# Patient Record
Sex: Male | Born: 1955 | Race: White | Hispanic: No | Marital: Married | Smoking: Former smoker
Health system: Southern US, Community
[De-identification: ages and names within clinical notes are randomized; demographics above are authoritative.]

## PROBLEM LIST (undated history)

## (undated) DIAGNOSIS — I10 Essential (primary) hypertension: Secondary | ICD-10-CM

## (undated) DIAGNOSIS — N400 Enlarged prostate without lower urinary tract symptoms: Secondary | ICD-10-CM

## (undated) DIAGNOSIS — C61 Malignant neoplasm of prostate: Secondary | ICD-10-CM

## (undated) DIAGNOSIS — K219 Gastro-esophageal reflux disease without esophagitis: Secondary | ICD-10-CM

## (undated) DIAGNOSIS — C801 Malignant (primary) neoplasm, unspecified: Secondary | ICD-10-CM

## (undated) HISTORY — DX: Essential (primary) hypertension: I10

## (undated) HISTORY — DX: Benign prostatic hyperplasia without lower urinary tract symptoms: N40.0

## (undated) HISTORY — PX: HERNIA REPAIR: SHX51

## (undated) HISTORY — DX: Gastro-esophageal reflux disease without esophagitis: K21.9

## (undated) HISTORY — DX: Malignant (primary) neoplasm, unspecified: C80.1

## (undated) HISTORY — PX: OTHER SURGICAL HISTORY: SHX169

## (undated) HISTORY — PX: TONSILLECTOMY: SUR1361

## (undated) HISTORY — PX: PROSTATE BIOPSY: SHX241

---

## 2007-06-09 ENCOUNTER — Ambulatory Visit: Payer: Self-pay | Admitting: Family Medicine

## 2007-06-09 DIAGNOSIS — I10 Essential (primary) hypertension: Secondary | ICD-10-CM | POA: Insufficient documentation

## 2007-10-25 ENCOUNTER — Ambulatory Visit: Payer: Self-pay | Admitting: Family Medicine

## 2007-10-25 ENCOUNTER — Encounter: Admission: RE | Admit: 2007-10-25 | Discharge: 2007-10-25 | Payer: Self-pay | Admitting: Family Medicine

## 2007-10-25 DIAGNOSIS — M545 Low back pain, unspecified: Secondary | ICD-10-CM | POA: Insufficient documentation

## 2007-10-25 DIAGNOSIS — K219 Gastro-esophageal reflux disease without esophagitis: Secondary | ICD-10-CM | POA: Insufficient documentation

## 2007-10-26 ENCOUNTER — Encounter: Payer: Self-pay | Admitting: Family Medicine

## 2007-10-27 ENCOUNTER — Telehealth: Payer: Self-pay | Admitting: Family Medicine

## 2007-10-27 DIAGNOSIS — C61 Malignant neoplasm of prostate: Secondary | ICD-10-CM | POA: Insufficient documentation

## 2007-10-27 LAB — CONVERTED CEMR LAB
ALT: 30 units/L (ref 0–53)
AST: 15 units/L (ref 0–37)
Albumin: 4.7 g/dL (ref 3.5–5.2)
Alkaline Phosphatase: 62 units/L (ref 39–117)
BUN: 14 mg/dL (ref 6–23)
CO2: 23 meq/L (ref 19–32)
Calcium: 9.7 mg/dL (ref 8.4–10.5)
Chloride: 108 meq/L (ref 96–112)
Cholesterol: 168 mg/dL (ref 0–200)
Creatinine, Ser: 0.93 mg/dL (ref 0.40–1.50)
Glucose, Bld: 98 mg/dL (ref 70–99)
HDL: 37 mg/dL — ABNORMAL LOW (ref >39)
LDL Cholesterol: 113 mg/dL — ABNORMAL HIGH (ref 0–99)
PSA: 1.94 ng/mL (ref 0.10–4.00)
Potassium: 4.6 meq/L (ref 3.5–5.3)
Sodium: 142 meq/L (ref 135–145)
Total Bilirubin: 0.9 mg/dL (ref 0.3–1.2)
Total CHOL/HDL Ratio: 4.5
Total Protein: 7.2 g/dL (ref 6.0–8.3)
Triglycerides: 90 mg/dL (ref <150)
VLDL: 18 mg/dL (ref 0–40)

## 2007-10-28 ENCOUNTER — Encounter: Admission: RE | Admit: 2007-10-28 | Discharge: 2007-10-28 | Payer: Self-pay | Admitting: Family Medicine

## 2007-10-31 ENCOUNTER — Encounter: Payer: Self-pay | Admitting: Family Medicine

## 2007-11-02 ENCOUNTER — Encounter: Payer: Self-pay | Admitting: Family Medicine

## 2007-11-14 ENCOUNTER — Encounter: Payer: Self-pay | Admitting: Family Medicine

## 2007-11-14 DIAGNOSIS — Z87898 Personal history of other specified conditions: Secondary | ICD-10-CM | POA: Insufficient documentation

## 2007-11-23 ENCOUNTER — Ambulatory Visit: Payer: Self-pay | Admitting: Oncology

## 2007-11-23 ENCOUNTER — Encounter: Payer: Self-pay | Admitting: Family Medicine

## 2007-12-01 ENCOUNTER — Encounter: Payer: Self-pay | Admitting: Family Medicine

## 2007-12-02 ENCOUNTER — Encounter: Payer: Self-pay | Admitting: Family Medicine

## 2007-12-02 LAB — CBC WITH DIFFERENTIAL/PLATELET
Basophils Absolute: 0 10*3/uL (ref 0.0–0.1)
EOS%: 3.1 % (ref 0.0–7.0)
HGB: 15.9 g/dL (ref 13.0–17.1)
MCH: 31.8 pg (ref 28.0–33.4)
NEUT#: 3.5 10*3/uL (ref 1.5–6.5)
RBC: 5.01 10*6/uL (ref 4.20–5.71)
RDW: 13 % (ref 11.2–14.6)
lymph#: 1.9 10*3/uL (ref 0.9–3.3)

## 2007-12-02 LAB — COMPREHENSIVE METABOLIC PANEL
ALT: 22 U/L (ref 0–53)
AST: 12 U/L (ref 0–37)
Albumin: 4.6 g/dL (ref 3.5–5.2)
BUN: 12 mg/dL (ref 6–23)
Calcium: 9.4 mg/dL (ref 8.4–10.5)
Chloride: 108 mEq/L (ref 96–112)
Potassium: 4.3 mEq/L (ref 3.5–5.3)
Sodium: 139 mEq/L (ref 135–145)
Total Protein: 6.9 g/dL (ref 6.0–8.3)

## 2007-12-06 ENCOUNTER — Ambulatory Visit (HOSPITAL_COMMUNITY): Admission: RE | Admit: 2007-12-06 | Discharge: 2007-12-06 | Payer: Self-pay | Admitting: Oncology

## 2007-12-12 ENCOUNTER — Encounter: Payer: Self-pay | Admitting: Family Medicine

## 2008-02-28 ENCOUNTER — Ambulatory Visit: Payer: Self-pay | Admitting: Oncology

## 2008-03-01 ENCOUNTER — Encounter: Payer: Self-pay | Admitting: Family Medicine

## 2008-03-01 LAB — COMPREHENSIVE METABOLIC PANEL
Albumin: 4.2 g/dL (ref 3.5–5.2)
BUN: 16 mg/dL (ref 6–23)
Calcium: 9.3 mg/dL (ref 8.4–10.5)
Chloride: 107 mEq/L (ref 96–112)
Glucose, Bld: 146 mg/dL — ABNORMAL HIGH (ref 70–99)
Potassium: 4.1 mEq/L (ref 3.5–5.3)

## 2008-03-01 LAB — CBC WITH DIFFERENTIAL/PLATELET
Basophils Absolute: 0 10*3/uL (ref 0.0–0.1)
Eosinophils Absolute: 0.3 10*3/uL (ref 0.0–0.5)
HGB: 15.5 g/dL (ref 13.0–17.1)
NEUT#: 3.8 10*3/uL (ref 1.5–6.5)
RDW: 13 % (ref 11.2–14.6)
lymph#: 1.3 10*3/uL (ref 0.9–3.3)

## 2008-03-19 ENCOUNTER — Encounter: Payer: Self-pay | Admitting: Family Medicine

## 2008-06-27 ENCOUNTER — Telehealth: Payer: Self-pay | Admitting: Family Medicine

## 2008-08-31 ENCOUNTER — Ambulatory Visit: Payer: Self-pay | Admitting: Oncology

## 2008-09-21 ENCOUNTER — Encounter: Payer: Self-pay | Admitting: Family Medicine

## 2008-09-21 LAB — COMPREHENSIVE METABOLIC PANEL
ALT: 30 U/L (ref 0–53)
CO2: 23 mEq/L (ref 19–32)
Creatinine, Ser: 0.89 mg/dL (ref 0.40–1.50)
Glucose, Bld: 101 mg/dL — ABNORMAL HIGH (ref 70–99)
Total Bilirubin: 0.9 mg/dL (ref 0.3–1.2)

## 2008-09-21 LAB — CBC WITH DIFFERENTIAL/PLATELET
Basophils Absolute: 0 10*3/uL (ref 0.0–0.1)
Eosinophils Absolute: 0.2 10*3/uL (ref 0.0–0.5)
HGB: 16.3 g/dL (ref 13.0–17.1)
MCV: 89.4 fL (ref 81.6–98.0)
MONO%: 6.9 % (ref 0.0–13.0)
NEUT#: 5.1 10*3/uL (ref 1.5–6.5)
RDW: 12.9 % (ref 11.2–14.6)

## 2008-09-21 LAB — PSA: PSA: 1.32 ng/mL (ref 0.10–4.00)

## 2008-09-21 LAB — LACTATE DEHYDROGENASE: LDH: 121 U/L (ref 94–250)

## 2008-12-06 ENCOUNTER — Telehealth (INDEPENDENT_AMBULATORY_CARE_PROVIDER_SITE_OTHER): Payer: Self-pay | Admitting: *Deleted

## 2009-01-05 IMAGING — PT NM PET TUM IMG SKULL BASE T - THIGH
6 series · 25 of 25 positions shown · non-contrast
Comparison: None.

CLINICAL DATA: 51-year-old with prostate cancer.
 FDG PET-CT TUMOR IMAGING (SKULL BASE TO THIGHS):

 Fasting Blood Glucose: 133
TECHNIQUE: 18.2 mCi F-18 FDG were administered via the right forearm.  Full ring PET imaging was performed from the skull base through the mid-thighs 62 minutes after injection.  CT data was obtained and used for attenuation correction and anatomic localization only.  (This was not acquired as a diagnostic CT examination.)

[Series 1: pet ac · axial · 3.3mm · 4.69mm/px · z∈[-870,+0]mm · 5 of 267 slices shown]
[im 1/267]
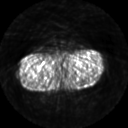
[im 67/267]
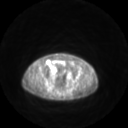
[im 134/267]
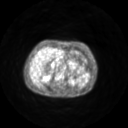
[im 200/267]
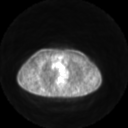
[im 267/267]
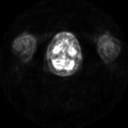

[Series 2: pet nac · axial · 3.3mm · 4.69mm/px · z∈[-870,+0]mm · 6 of 267 slices shown]
[im 1/267]
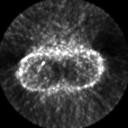
[im 54/267]
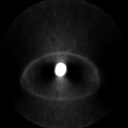
[im 107/267]
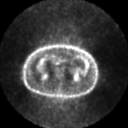
[im 160/267]
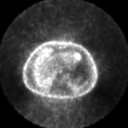
[im 213/267]
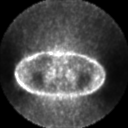
[im 267/267]
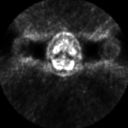

[Series 2: ct images · axial · 3.8mm · 0.98mm/px · z∈[-870,+0]mm · 5 of 267 slices shown]
[im 1/267]
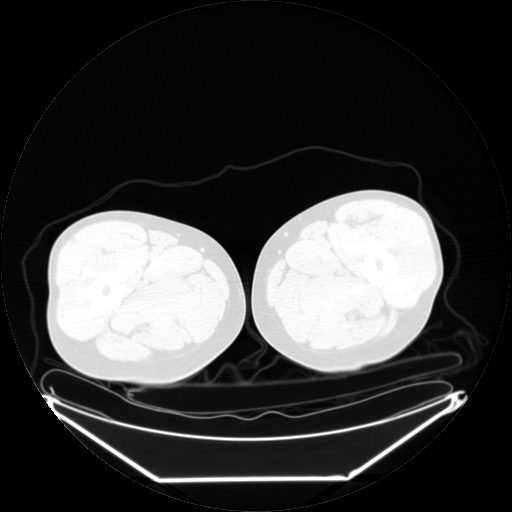
[im 67/267]
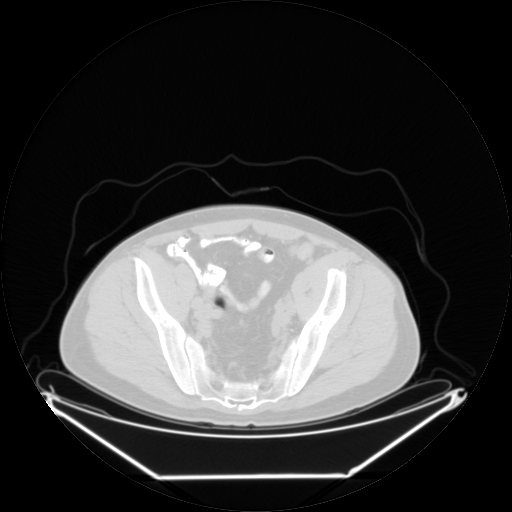
[im 134/267]
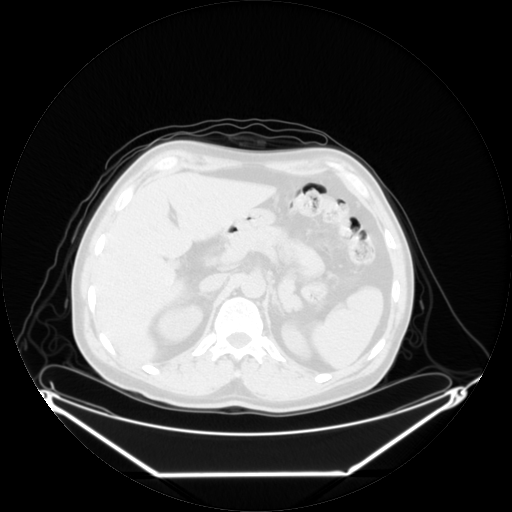
[im 200/267]
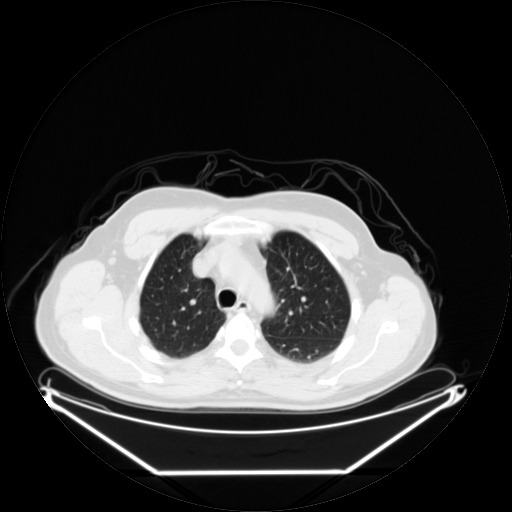
[im 267/267  brain]
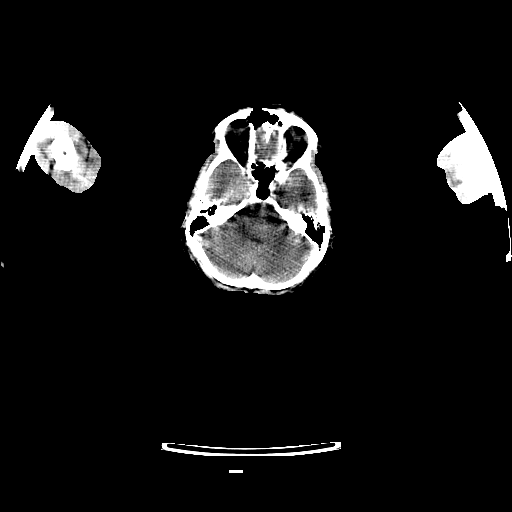

[Series 123: mip · coronal · 3.3mm · 4.69mm/px · 1 of 30 slices shown]
[im 1/30]
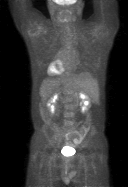

[Series 151: reformatted · axial · 3.3mm · 3.91mm/px · z∈[-870,+0]mm · 6 of 265 slices shown (1 of 2)]
[im 1/265]
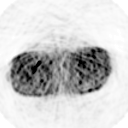
[im 53/265]
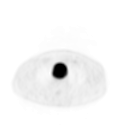
[im 106/265]
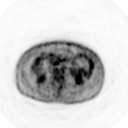
[im 159/265]
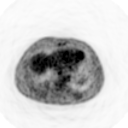
[im 212/265]
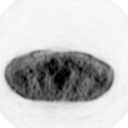
[im 265/265]
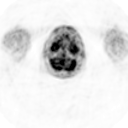

[Series 154: reformatted · coronal · 4.7mm · 6.98mm/px · 2 of 70 slices shown (2 of 2)]
[im 1/70]
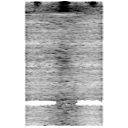
[im 70/70]
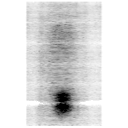

[25 of 25 positions shown; findings below may reference images not displayed]

FINDINGS: I do not see any areas of abnormal FDG uptake in the neck, chest, abdomen, or pelvis to suggest metastatic disease. On the unenhanced CT portion of this examination I do not see any enlarged lymph nodes.  A small anterior mediastinal node is noted along with a few small mediastinal hilar lymph nodes. I do not see any areas of abnormal activity in the bony skeleton to suggest bone metastasis. 
 There is a small subcutaneous nodule in the upper left thorax posteriorly, which does demonstrate slight increased FDG uptake. SUV max is 1.8 and this is likely a benign subcutaneous lesion. 
 There are a few small questionable areas of increased activity in the thoracolumbar spine. I do not see any corresponding CT abnormality and I think it is unlikely that this is metastatic disease. I would, however, recommend correlation with a whole body bone scan.
IMPRESSION: No definite findings for metastatic disease involving the neck, chest, abdomen, or pelvis. There are some patchy abnormalities in the vertebral bodies, but I think this is just normal marrow activity and I do not see any corresponding CT abnormality.  A whole body bone scan is suggested for followup.

## 2009-01-21 ENCOUNTER — Telehealth (INDEPENDENT_AMBULATORY_CARE_PROVIDER_SITE_OTHER): Payer: Self-pay | Admitting: *Deleted

## 2009-01-21 ENCOUNTER — Ambulatory Visit: Payer: Self-pay | Admitting: Family Medicine

## 2009-01-21 DIAGNOSIS — F4322 Adjustment disorder with anxiety: Secondary | ICD-10-CM | POA: Insufficient documentation

## 2009-01-21 DIAGNOSIS — L500 Allergic urticaria: Secondary | ICD-10-CM | POA: Insufficient documentation

## 2009-01-22 ENCOUNTER — Encounter: Payer: Self-pay | Admitting: Family Medicine

## 2009-01-23 ENCOUNTER — Encounter: Payer: Self-pay | Admitting: Family Medicine

## 2009-02-18 ENCOUNTER — Ambulatory Visit: Payer: Self-pay | Admitting: Oncology

## 2009-02-26 ENCOUNTER — Encounter: Payer: Self-pay | Admitting: Family Medicine

## 2009-03-04 ENCOUNTER — Telehealth (INDEPENDENT_AMBULATORY_CARE_PROVIDER_SITE_OTHER): Payer: Self-pay | Admitting: *Deleted

## 2009-05-16 ENCOUNTER — Encounter: Payer: Self-pay | Admitting: Family Medicine

## 2009-05-17 ENCOUNTER — Encounter: Payer: Self-pay | Admitting: Family Medicine

## 2009-05-17 ENCOUNTER — Ambulatory Visit: Payer: Self-pay | Admitting: Oncology

## 2009-05-17 LAB — COMPREHENSIVE METABOLIC PANEL
ALT: 26 U/L (ref 0–53)
AST: 14 U/L (ref 0–37)
Alkaline Phosphatase: 66 U/L (ref 39–117)
CO2: 19 mEq/L (ref 19–32)
Creatinine, Ser: 0.87 mg/dL (ref 0.40–1.50)
Total Bilirubin: 1.3 mg/dL — ABNORMAL HIGH (ref 0.3–1.2)

## 2009-05-17 LAB — CBC WITH DIFFERENTIAL/PLATELET
BASO%: 0.6 % (ref 0.0–2.0)
EOS%: 3.1 % (ref 0.0–7.0)
HCT: 45 % (ref 38.4–49.9)
LYMPH%: 24.1 % (ref 14.0–49.0)
MCH: 31.4 pg (ref 27.2–33.4)
MCHC: 35.6 g/dL (ref 32.0–36.0)
MONO%: 8 % (ref 0.0–14.0)
NEUT%: 64.2 % (ref 39.0–75.0)
Platelets: 197 10*3/uL (ref 140–400)

## 2009-05-17 LAB — LACTATE DEHYDROGENASE: LDH: 144 U/L (ref 94–250)

## 2009-05-28 ENCOUNTER — Encounter: Payer: Self-pay | Admitting: Family Medicine

## 2009-08-28 ENCOUNTER — Telehealth (INDEPENDENT_AMBULATORY_CARE_PROVIDER_SITE_OTHER): Payer: Self-pay | Admitting: *Deleted

## 2009-09-16 ENCOUNTER — Ambulatory Visit: Payer: Self-pay | Admitting: Family Medicine

## 2009-09-16 DIAGNOSIS — H5310 Unspecified subjective visual disturbances: Secondary | ICD-10-CM | POA: Insufficient documentation

## 2009-09-18 ENCOUNTER — Encounter: Payer: Self-pay | Admitting: Family Medicine

## 2009-09-19 LAB — CONVERTED CEMR LAB
ALT: 22 units/L (ref 0–53)
AST: 12 units/L (ref 0–37)
Albumin: 4.4 g/dL (ref 3.5–5.2)
Alkaline Phosphatase: 60 units/L (ref 39–117)
BUN: 12 mg/dL (ref 6–23)
Basophils Absolute: 0 10*3/uL (ref 0.0–0.1)
Basophils Relative: 1 % (ref 0–1)
CO2: 25 meq/L (ref 19–32)
Calcium: 9.3 mg/dL (ref 8.4–10.5)
Chloride: 106 meq/L (ref 96–112)
Cholesterol: 188 mg/dL (ref 0–200)
Creatinine, Ser: 0.9 mg/dL (ref 0.40–1.50)
Eosinophils Absolute: 0.3 10*3/uL (ref 0.0–0.7)
Eosinophils Relative: 4 % (ref 0–5)
Glucose, Bld: 97 mg/dL (ref 70–99)
HCT: 46 % (ref 39.0–52.0)
HDL: 34 mg/dL — ABNORMAL LOW (ref >39)
Hemoglobin: 16.1 g/dL (ref 13.0–17.0)
LDL Cholesterol: 133 mg/dL — ABNORMAL HIGH (ref 0–99)
Lymphocytes Relative: 29 % (ref 12–46)
Lymphs Abs: 1.7 10*3/uL (ref 0.7–4.0)
MCHC: 35 g/dL (ref 30.0–36.0)
MCV: 88.3 fL (ref 78.0–100.0)
Monocytes Absolute: 0.5 10*3/uL (ref 0.1–1.0)
Monocytes Relative: 9 % (ref 3–12)
Neutro Abs: 3.2 10*3/uL (ref 1.7–7.7)
Neutrophils Relative %: 57 % (ref 43–77)
Platelets: 212 10*3/uL (ref 150–400)
Potassium: 4.4 meq/L (ref 3.5–5.3)
RBC: 5.21 M/uL (ref 4.22–5.81)
RDW: 12.8 % (ref 11.5–15.5)
Sodium: 140 meq/L (ref 135–145)
Total Bilirubin: 1 mg/dL (ref 0.3–1.2)
Total CHOL/HDL Ratio: 5.5
Total Protein: 6.6 g/dL (ref 6.0–8.3)
Triglycerides: 103 mg/dL (ref <150)
VLDL: 21 mg/dL (ref 0–40)
WBC: 5.6 10*3/uL (ref 4.0–10.5)

## 2009-10-21 ENCOUNTER — Ambulatory Visit: Payer: Self-pay | Admitting: Oncology

## 2009-10-22 ENCOUNTER — Encounter: Payer: Self-pay | Admitting: Family Medicine

## 2009-10-22 LAB — CBC WITH DIFFERENTIAL/PLATELET
Basophils Absolute: 0.1 10*3/uL (ref 0.0–0.1)
Eosinophils Absolute: 0.3 10*3/uL (ref 0.0–0.5)
HGB: 16 g/dL (ref 13.0–17.1)
MCV: 90.6 fL (ref 79.3–98.0)
MONO#: 0.7 10*3/uL (ref 0.1–0.9)
MONO%: 9.4 % (ref 0.0–14.0)
NEUT#: 4.2 10*3/uL (ref 1.5–6.5)
RBC: 5.02 10*6/uL (ref 4.20–5.82)
RDW: 13.1 % (ref 11.0–14.6)
WBC: 7.1 10*3/uL (ref 4.0–10.3)
lymph#: 1.9 10*3/uL (ref 0.9–3.3)

## 2009-10-22 LAB — COMPREHENSIVE METABOLIC PANEL
Albumin: 4.3 g/dL (ref 3.5–5.2)
Alkaline Phosphatase: 57 U/L (ref 39–117)
BUN: 13 mg/dL (ref 6–23)
Calcium: 9.2 mg/dL (ref 8.4–10.5)
Chloride: 105 mEq/L (ref 96–112)
Glucose, Bld: 84 mg/dL (ref 70–99)
Potassium: 4.3 mEq/L (ref 3.5–5.3)
Sodium: 138 mEq/L (ref 135–145)
Total Protein: 6.6 g/dL (ref 6.0–8.3)

## 2009-10-22 LAB — PSA: PSA: 4.26 ng/mL — ABNORMAL HIGH (ref 0.10–4.00)

## 2010-04-18 ENCOUNTER — Ambulatory Visit: Payer: Self-pay | Admitting: Oncology

## 2010-04-22 ENCOUNTER — Encounter: Payer: Self-pay | Admitting: Family Medicine

## 2010-04-22 LAB — CBC WITH DIFFERENTIAL/PLATELET
BASO%: 0.6 % (ref 0.0–2.0)
EOS%: 4.5 % (ref 0.0–7.0)
HCT: 44.2 % (ref 38.4–49.9)
LYMPH%: 34.3 % (ref 14.0–49.0)
MCH: 32.1 pg (ref 27.2–33.4)
MCHC: 36 g/dL (ref 32.0–36.0)
NEUT%: 50.8 % (ref 39.0–75.0)
Platelets: 192 10*3/uL (ref 140–400)
RBC: 4.96 10*6/uL (ref 4.20–5.82)
lymph#: 2.3 10*3/uL (ref 0.9–3.3)

## 2010-04-23 LAB — COMPREHENSIVE METABOLIC PANEL
ALT: 37 U/L (ref 0–53)
AST: 17 U/L (ref 0–37)
Creatinine, Ser: 0.91 mg/dL (ref 0.40–1.50)
Sodium: 138 mEq/L (ref 135–145)
Total Bilirubin: 0.7 mg/dL (ref 0.3–1.2)
Total Protein: 6.8 g/dL (ref 6.0–8.3)

## 2010-08-13 ENCOUNTER — Ambulatory Visit: Payer: Self-pay | Admitting: Family Medicine

## 2010-10-17 ENCOUNTER — Ambulatory Visit: Payer: Self-pay | Admitting: Oncology

## 2010-10-31 ENCOUNTER — Encounter: Payer: Self-pay | Admitting: Family Medicine

## 2010-10-31 LAB — CBC WITH DIFFERENTIAL/PLATELET
BASO%: 1.1 % (ref 0.0–2.0)
Basophils Absolute: 0.1 10*3/uL (ref 0.0–0.1)
EOS%: 4.3 % (ref 0.0–7.0)
Eosinophils Absolute: 0.2 10*3/uL (ref 0.0–0.5)
HCT: 46.1 % (ref 38.4–49.9)
HGB: 16.3 g/dL (ref 13.0–17.1)
LYMPH%: 25.9 % (ref 14.0–49.0)
MCH: 31.5 pg (ref 27.2–33.4)
MCHC: 35.5 g/dL (ref 32.0–36.0)
MCV: 88.9 fL (ref 79.3–98.0)
MONO#: 0.4 10*3/uL (ref 0.1–0.9)
MONO%: 7 % (ref 0.0–14.0)
NEUT#: 3.6 10*3/uL (ref 1.5–6.5)
NEUT%: 61.7 % (ref 39.0–75.0)
Platelets: 213 10*3/uL (ref 140–400)
RBC: 5.18 10*6/uL (ref 4.20–5.82)
RDW: 13 % (ref 11.0–14.6)
WBC: 5.8 10*3/uL (ref 4.0–10.3)
lymph#: 1.5 10*3/uL (ref 0.9–3.3)

## 2010-10-31 LAB — COMPREHENSIVE METABOLIC PANEL
ALT: 27 U/L (ref 0–53)
AST: 14 U/L (ref 0–37)
Albumin: 4.4 g/dL (ref 3.5–5.2)
Alkaline Phosphatase: 58 U/L (ref 39–117)
BUN: 12 mg/dL (ref 6–23)
CO2: 23 mEq/L (ref 19–32)
Calcium: 9.2 mg/dL (ref 8.4–10.5)
Chloride: 104 mEq/L (ref 96–112)
Creatinine, Ser: 0.91 mg/dL (ref 0.40–1.50)
Glucose, Bld: 136 mg/dL — ABNORMAL HIGH (ref 70–99)
Potassium: 3.8 mEq/L (ref 3.5–5.3)
Sodium: 137 mEq/L (ref 135–145)
Total Bilirubin: 1 mg/dL (ref 0.3–1.2)
Total Protein: 6.6 g/dL (ref 6.0–8.3)

## 2010-10-31 LAB — PSA: PSA: 1.52 ng/mL (ref <=4.00)

## 2010-11-11 NOTE — Assessment & Plan Note (Signed)
Summary: f/u BP   Vital Signs:  Patient profile:   55 year old male Height:      70.75 inches Weight:      197 pounds BMI:     27.77 O2 Sat:      96 % on Room air Pulse rate:   78 / minute BP sitting:   139 / 92  (left arm) Cuff size:   large  Vitals Entered By: Payton Spark CMA (August 13, 2010 10:49 AM)  O2 Flow:  Room air CC: F/U HTN.    Primary Care Arjen Deringer:  Seymour Bars DO  CC:  F/U HTN. Marland Kitchen  History of Present Illness: 55 yo WM presents for f/u HTN.  He admits to some medication non adherence  with his Norvasc and thinks it's not working.  He was on Previously on Avapro and changed due to cost.  He denies CP, DOE, palpitations or leg swelling.  Fasting labs are due next month.  He has borderline high cholesterol, not on treatment.  He has gained 10 lbs since our last visit and is not getting much exercise.  He saw oncology in [place] in the summer and has has urology f/u for hx of prostate cancer with [facility] this year.  He goes back to the oncologist in Jan for f/u hx of lymphoma and prostate cancer and appears to be doing very well.    Current Medications (verified): 1)  Nexium 40 Mg  Cpdr (Esomeprazole Magnesium) .... Take 1 Tablet By Mouth Once A Day 2)  Norvasc 5 Mg Tabs (Amlodipine Besylate) .Marland Kitchen.. 1 Tab By Mouth Daily 3)  Valium 2 Mg Tabs (Diazepam) .Marland Kitchen.. 1 Tab By Mouth At Bedtime As Needed Anxiety  Allergies (verified): 1)  ! Sulfa  Past History:  Past Medical History: Reviewed history from 01/21/2009 and no changes required. GERD HTN Low Grade Follicular Lymphoma, Grade 2, Stage 1E.  s/p external beam radiation.  Dr Clelia Croft. Prostate cancer, Gleason 6.  Watched by Dr Archer Asa  Past Surgical History: Reviewed history from 01/21/2009 and no changes required. Tonsillectomy Prostate biopsy - BPH; Urology in [facility]. L axillary lymphoma  removed and radiated  Social History: Reviewed history from 06/09/2007 and no changes required. Moved from [place]  in 2007. Hospital doctor for [employer]. Married with a teenage daughter. quit smoking in 2004 - 1 ppw 3-4 beers/ wk  Review of Systems      See HPI  Physical Exam  General:  alert, well-developed, well-nourished, and well-hydrated.   Head:  normocephalic and atraumatic.   Mouth:  pharynx pink and moist.   Neck:  no masses.   Lungs:  Normal respiratory effort, chest expands symmetrically. Lungs are clear to auscultation, no crackles or wheezes. Heart:  Normal rate and regular rhythm. S1 and S2 normal without gallop, murmur, click, rub or other extra sounds. Pulses:  2+ radial pulses Extremities:  no E/C/C Skin:  color normal.   Psych:  good eye contact, not anxious appearing, and not depressed appearing.     Impression & Recommendations:  Problem # 1:  HYPERTENSION, BENIGN ESSENTIAL (ICD-401.1) BP still high on Norvasc.  Will stop and change him to Lisinopril/ HCTZ once daily.  Call if any problems.  Update fasting labs in DEC.  Work on healthy (low sodium) diet, regular exercise and wt loss.   The following medications were removed from the medication list:    Norvasc 5 Mg Tabs (Amlodipine besylate) .Marland Kitchen... 1 tab by mouth daily His updated medication list  for this problem includes:    Lisinopril-hydrochlorothiazide 20-12.5 Mg Tabs (Lisinopril-hydrochlorothiazide) .Marland Kitchen... 1 tab by mouth daily  BP today: 139/92 Prior BP: 124/74 (09/16/2009)  Prior 10 Yr Risk Heart Disease: 9 % (09/16/2009)  Labs Reviewed: K+: 4.4 (09/18/2009) Creat: : 0.90 (09/18/2009)   Chol: 188 (09/18/2009)   HDL: 34 (09/18/2009)   LDL: 133 (09/18/2009)   TG: 103 (09/18/2009)  Orders: T-Comprehensive Metabolic Panel ([id number])  Problem # 2:  NEOPLASM, MALIGNANT, PROSTATE (ICD-185) Seeing [facility].  PSA is UTD per Urology.  Complete Medication List: 1)  Nexium 40 Mg Cpdr (Esomeprazole magnesium) .... Take 1 tablet by mouth once a day 2)  Valium 2 Mg Tabs (Diazepam) .Marland Kitchen.. 1 tab by mouth at bedtime as needed  anxiety 3)  Nystatin-triamcinolone 100000-0.1 Unit/gm-% Oint (Nystatin-triamcinolone) .... Apply two times a day to rash as needed 4)  Lisinopril-hydrochlorothiazide 20-12.5 Mg Tabs (Lisinopril-hydrochlorothiazide) .Marland Kitchen.. 1 tab by mouth daily  Other Orders: T-CBC w/Diff ([id number]) T-Lipid Profile ([id number])  Patient Instructions: 1)  Update fasting labs after 12-8 downstairs. 2)  Will call you w/ results. 3)  Stick to a LOW SODIUM diet with regular exercise. 4)  Change Norvasc to Lisinopril/ HCTZ once daily for BP. 5)  Start Omega 3 Fish Oil 3-4 grams daily. 6)  Return for f/u in 3 mos. Prescriptions: LISINOPRIL-HYDROCHLOROTHIAZIDE 20-12.5 MG TABS (LISINOPRIL-HYDROCHLOROTHIAZIDE) 1 tab by mouth daily  #30 x 2   Entered and Authorized by:   Seymour Bars DO   Signed by:   Seymour Bars DO on 08/13/2010   Method used:   Electronically to        [facility]       [address]       [place], [place]  [address]       Ph: [phone]       Fax: [phone]   RxID:   [id number] NYSTATIN-TRIAMCINOLONE 100000-0.1 UNIT/GM-% OINT (NYSTATIN-TRIAMCINOLONE) apply two times a day to rash as needed  #1 tube x 0   Entered and Authorized by:   Seymour Bars DO   Signed by:   Seymour Bars DO on 08/13/2010   Method used:   Electronically to        [facility] [address] [facility] ([facility]       [address]       [place], [place]  [address]       Ph: [phone]       Fax: [phone]   RxID:   [id number] VALIUM 2 MG TABS (DIAZEPAM) 1 tab by mouth at bedtime as needed anxiety  #30 x 0   Entered and Authorized by:   Seymour Bars DO   Signed by:   Seymour Bars DO on 08/13/2010   Method used:   Printed then faxed to ...       [facility] [address] (retail)       [address]       [place], [place]  [address]       Ph: [phone]       Fax: [phone]   RxID:   [id number]    Orders Added: 1)  T-CBC w/Diff [[id number]] 2)  T-Comprehensive Metabolic Panel  [[id number]] 3)  T-Lipid Profile [[id number]] 4)  Est. Patient Level III [96789]

## 2010-11-11 NOTE — Letter (Signed)
[facility]   Imported By: Lanelle Bal 11/18/2009 11:28:00  _____________________________________________________________________  External Attachment:    Type:   Image     Comment:   External Document

## 2010-11-11 NOTE — Letter (Signed)
[facility]   Imported By: Lanelle Bal 05/05/2010 08:48:18  _____________________________________________________________________  External Attachment:    Type:   Image     Comment:   External Document

## 2010-11-17 ENCOUNTER — Ambulatory Visit: Payer: Self-pay | Admitting: Family Medicine

## 2010-12-03 NOTE — Letter (Signed)
Summary: [facility]  [facility]   Imported By: Kassie Mends 11/26/2010 08:56:17  _____________________________________________________________________  External Attachment:    Type:   Image     Comment:   External Document

## 2011-02-17 ENCOUNTER — Other Ambulatory Visit: Payer: Self-pay | Admitting: *Deleted

## 2011-02-17 ENCOUNTER — Other Ambulatory Visit: Payer: Self-pay | Admitting: Family Medicine

## 2011-02-17 MED ORDER — ESOMEPRAZOLE MAGNESIUM 40 MG PO CPDR: 40.0000 mg | DELAYED_RELEASE_CAPSULE | Freq: Every day | ORAL | Status: DC

## 2011-02-17 NOTE — Telephone Encounter (Signed)
Wants refill Nexium 40 mg po daily.  When researching prescription [facility].  Pt informed. Jarvis Newcomer, LPN Domingo Dimes

## 2011-03-16 ENCOUNTER — Telehealth: Payer: Self-pay | Admitting: *Deleted

## 2011-03-16 DIAGNOSIS — Z1329 Encounter for screening for other suspected endocrine disorder: Secondary | ICD-10-CM

## 2011-03-16 DIAGNOSIS — Z13220 Encounter for screening for lipoid disorders: Secondary | ICD-10-CM

## 2011-03-16 DIAGNOSIS — Z130 Encounter for screening for diseases of the blood and blood-forming organs and certain disorders involving the immune mechanism: Secondary | ICD-10-CM

## 2011-03-16 NOTE — Telephone Encounter (Signed)
Pt states he is due for lab work but he has to have [facility] at [facility]. Please enter and I will fax to lab.

## 2011-03-16 NOTE — Telephone Encounter (Addendum)
Pt called and is returning call to [place].  Tried to look at the phone note, but I see where he needs to have labs at [facility] but I cannot tell what message needs to be conveyed to the patient.  Please call the pt to discuss. Plan:  Routed to [facility], CMA Jarvis Newcomer, LPN Domingo Dimes     03-17-11 @ 11:39am Pt called back again and said he never got a return call from Payton Spark, CMA.  He needs the lab order so he can get his labs drawn at [facility].  Please respond to the pt request.  Thanks.  I had tried yesterday to look at the patient telephone note, but couldn't tell based on the note what actually needed to be ordered. Plan:  Routed again to Payton Spark, CMA

## 2011-04-05 ENCOUNTER — Encounter: Payer: Self-pay | Admitting: Family Medicine

## 2011-04-14 ENCOUNTER — Ambulatory Visit (INDEPENDENT_AMBULATORY_CARE_PROVIDER_SITE_OTHER): Payer: BC Managed Care – PPO | Admitting: Family Medicine

## 2011-04-14 ENCOUNTER — Encounter: Payer: Self-pay | Admitting: Family Medicine

## 2011-04-14 DIAGNOSIS — I10 Essential (primary) hypertension: Secondary | ICD-10-CM

## 2011-04-14 DIAGNOSIS — K219 Gastro-esophageal reflux disease without esophagitis: Secondary | ICD-10-CM

## 2011-04-14 MED ORDER — AMLODIPINE BESYLATE 10 MG PO TABS: 10.0000 mg | ORAL_TABLET | Freq: Every day | ORAL | Status: DC

## 2011-04-14 MED ORDER — ESOMEPRAZOLE MAGNESIUM 40 MG PO CPDR: 40.0000 mg | DELAYED_RELEASE_CAPSULE | Freq: Every day | ORAL | Status: DC

## 2011-04-14 MED ORDER — DIAZEPAM 2 MG PO TABS: 2.0000 mg | ORAL_TABLET | Freq: Every evening | ORAL | Status: DC | PRN

## 2011-04-14 NOTE — Progress Notes (Signed)
  Subjective:    Patient ID: Jack Grimes, male    DOB: 04/04/1956, 55 y.o.   MRN: [id number]  HPI  55 yo WM presents for f/u visit.  He changed the Lisinopril/ HCTZ back to Norvasc.  He is tolerating this well but his BPs are still a little high.  He is following up with [facility]ology for his hx of lymphoma and prostate cancer.  He denies chest pain or SOB.   He [facility].  His cholesterol looks great.  CMP is normal.  Fasting glucose is 101.  He does c/o some mild ED.    BP 141/93  Pulse 92  Ht 5\' 11"  (1.803 m)  Wt 183 lb (83.008 kg)  BMI 25.52 kg/m2  SpO2 98%   Review of Systems  Constitutional: Negative for fatigue and unexpected weight change.  Eyes: Negative for visual disturbance.  Respiratory: Negative for shortness of breath.   Cardiovascular: Negative for chest pain, palpitations and leg swelling.  Neurological: Negative for headaches.  Psychiatric/Behavioral: Negative for dysphoric mood.       Objective:   Physical Exam  Constitutional: He appears well-developed and well-nourished.  HENT:  Mouth/Throat: Oropharynx is clear and moist.  Eyes: Conjunctivae are normal. No scleral icterus.  Neck: Neck supple. No thyromegaly present.  Cardiovascular: Normal rate, regular rhythm and normal heart sounds.   No murmur heard. Pulmonary/Chest: Effort normal.  Musculoskeletal: He exhibits no edema.  Lymphadenopathy:    He has no cervical adenopathy.  Psychiatric: He has a normal mood and affect.          Assessment & Plan:

## 2011-04-14 NOTE — Assessment & Plan Note (Signed)
RFd his Nexium today.

## 2011-04-14 NOTE — Assessment & Plan Note (Signed)
BP still a little high but at least he is tolerating norvasc well.  I do not think the norvasc is causing his erectile dysfunction, so encouraged him to stay on this.  Work on diet/ exercise.  Raise dose from 5--> 10 mg to get to goal of < 140/90.  Call if any problems.  F/u in 3-4 mos.  Reviewed labs today from [facility].

## 2011-04-14 NOTE — Patient Instructions (Signed)
Increase Amlodopine to 10 mg once daily for your blood pressure.  Trial of Viagra to use as needed.  Call me if you want RX for this.    Cholesterol and chemistry panel look great. Work on regular exercise and low sugar diet to get fasting sugar down.  Return for f/u BP in 3-4 mos.

## 2011-04-28 ENCOUNTER — Other Ambulatory Visit: Payer: Self-pay | Admitting: Oncology

## 2011-04-28 ENCOUNTER — Encounter (HOSPITAL_BASED_OUTPATIENT_CLINIC_OR_DEPARTMENT_OTHER): Payer: BC Managed Care – PPO | Admitting: Oncology

## 2011-04-28 DIAGNOSIS — C61 Malignant neoplasm of prostate: Secondary | ICD-10-CM

## 2011-04-28 DIAGNOSIS — C8299 Follicular lymphoma, unspecified, extranodal and solid organ sites: Secondary | ICD-10-CM

## 2011-04-28 LAB — COMPREHENSIVE METABOLIC PANEL
ALT: 19 U/L (ref 0–53)
AST: 11 U/L (ref 0–37)
Albumin: 4.2 g/dL (ref 3.5–5.2)
Alkaline Phosphatase: 53 U/L (ref 39–117)
Potassium: 3.9 mEq/L (ref 3.5–5.3)
Sodium: 139 mEq/L (ref 135–145)
Total Protein: 6.5 g/dL (ref 6.0–8.3)

## 2011-04-28 LAB — CBC WITH DIFFERENTIAL/PLATELET
BASO%: 0.6 % (ref 0.0–2.0)
EOS%: 1.9 % (ref 0.0–7.0)
MCH: 32.1 pg (ref 27.2–33.4)
MCV: 90.3 fL (ref 79.3–98.0)
MONO%: 7.3 % (ref 0.0–14.0)
NEUT#: 4.4 10*3/uL (ref 1.5–6.5)
RBC: 5.16 10*6/uL (ref 4.20–5.82)
RDW: 13.2 % (ref 11.0–14.6)

## 2011-05-04 ENCOUNTER — Encounter: Payer: Self-pay | Admitting: Family Medicine

## 2011-05-05 ENCOUNTER — Telehealth: Payer: Self-pay | Admitting: Family Medicine

## 2011-05-05 MED ORDER — SILDENAFIL CITRATE 50 MG PO TABS: 50.0000 mg | ORAL_TABLET | ORAL | Status: DC | PRN

## 2011-05-05 NOTE — Telephone Encounter (Signed)
Pt calls and states he'd like a Rx sent to pharm for viagra. You gave him a trial and he liked it! [facility].

## 2011-05-05 NOTE — Telephone Encounter (Signed)
Addended by: Glennis Brink on: 05/05/2011 04:34 PM   Modules accepted: Orders

## 2011-07-10 ENCOUNTER — Telehealth: Payer: Self-pay | Admitting: *Deleted

## 2011-07-10 MED ORDER — VARDENAFIL HCL 10 MG PO TABS: 10.0000 mg | ORAL_TABLET | ORAL | Status: DC | PRN

## 2011-07-10 NOTE — Telephone Encounter (Signed)
Pt calls and Dr. Cathey Endow had given him Cialis to take and its expensive so pt wants to know if you will call in Levitra. Please call into [facility] in [place] for #10.

## 2011-07-10 NOTE — Telephone Encounter (Signed)
rx sent

## 2011-07-14 ENCOUNTER — Other Ambulatory Visit: Payer: Self-pay | Admitting: *Deleted

## 2011-07-14 MED ORDER — VARDENAFIL HCL 10 MG PO TABS: 10.0000 mg | ORAL_TABLET | ORAL | Status: DC | PRN

## 2011-08-11 ENCOUNTER — Ambulatory Visit: Payer: BC Managed Care – PPO | Admitting: Family Medicine

## 2011-09-15 ENCOUNTER — Encounter: Payer: Self-pay | Admitting: *Deleted

## 2011-09-22 ENCOUNTER — Encounter: Payer: Self-pay | Admitting: Family Medicine

## 2011-09-24 ENCOUNTER — Encounter: Payer: Self-pay | Admitting: Family Medicine

## 2011-10-26 ENCOUNTER — Telehealth: Payer: Self-pay | Admitting: *Deleted

## 2011-10-26 NOTE — Telephone Encounter (Signed)
Received call from pt stating that he has received a bill from [facility] and he does not know why, so he wants to know what he should do regarding labs prior to his appt on 10/29/11 as he does not want anymore bills for lab work. Inquired with our lab to see if they knew why pt would be getting a bill from [facility] when our labs typically go through [facility]? Per lab all labs drawn here for this pt has [facility] but there is a chance that [facility] then sends [facility] to [facility], but they are unsure why that would happen. Lab encouraged pt to call number on bill for more information and if they cannot help to bring the bill to one of our financial counselors for assistance. Returned call to pt on cell to give above information. Pt verbalized understanding but would like to know which labs he will be having drawn so he knows if he needs to fast or not. Informed pt that this nurse could not find lab orders for 10/29/11 appt to know if fasting was required or not. Will leave a note for desk nurse on 10/27/11 to see what labs will be ordered and inform pt per pt request. Call back # [phone]

## 2011-10-27 ENCOUNTER — Other Ambulatory Visit: Payer: Self-pay | Admitting: Oncology

## 2011-10-27 ENCOUNTER — Encounter: Payer: Self-pay | Admitting: *Deleted

## 2011-10-27 DIAGNOSIS — C61 Malignant neoplasm of prostate: Secondary | ICD-10-CM

## 2011-10-29 ENCOUNTER — Telehealth: Payer: Self-pay | Admitting: *Deleted

## 2011-10-29 ENCOUNTER — Ambulatory Visit: Payer: BC Managed Care – PPO | Admitting: Oncology

## 2011-10-29 ENCOUNTER — Other Ambulatory Visit: Payer: BC Managed Care – PPO | Admitting: Lab

## 2011-10-29 NOTE — Telephone Encounter (Signed)
Have him make appt. i have never seen him.

## 2011-10-29 NOTE — Telephone Encounter (Signed)
Pt states that approx. 3-4 days a week his eyelids and inside of his jaw swell. He is concerned that it is from the Norvasc he is on. Please advise.

## 2011-10-30 NOTE — Telephone Encounter (Signed)
Lm for pt to schedule appt.

## 2011-11-19 ENCOUNTER — Other Ambulatory Visit: Payer: Self-pay | Admitting: *Deleted

## 2011-11-19 MED ORDER — ESOMEPRAZOLE MAGNESIUM 40 MG PO CPDR: 40.0000 mg | DELAYED_RELEASE_CAPSULE | Freq: Every day | ORAL | Status: DC

## 2011-11-20 ENCOUNTER — Ambulatory Visit: Payer: BC Managed Care – PPO | Admitting: Physician Assistant

## 2011-11-25 ENCOUNTER — Encounter: Payer: Self-pay | Admitting: *Deleted

## 2011-11-27 ENCOUNTER — Ambulatory Visit: Payer: BC Managed Care – PPO | Admitting: Physician Assistant

## 2011-11-30 ENCOUNTER — Encounter: Payer: Self-pay | Admitting: Physician Assistant

## 2011-11-30 ENCOUNTER — Ambulatory Visit (INDEPENDENT_AMBULATORY_CARE_PROVIDER_SITE_OTHER): Payer: BC Managed Care – PPO | Admitting: Physician Assistant

## 2011-11-30 VITALS — BP 133/76 | HR 75 | Wt 187.0 lb

## 2011-11-30 DIAGNOSIS — F419 Anxiety disorder, unspecified: Secondary | ICD-10-CM

## 2011-11-30 DIAGNOSIS — F411 Generalized anxiety disorder: Secondary | ICD-10-CM

## 2011-11-30 DIAGNOSIS — J309 Allergic rhinitis, unspecified: Secondary | ICD-10-CM

## 2011-11-30 DIAGNOSIS — N529 Male erectile dysfunction, unspecified: Secondary | ICD-10-CM

## 2011-11-30 DIAGNOSIS — K219 Gastro-esophageal reflux disease without esophagitis: Secondary | ICD-10-CM

## 2011-11-30 DIAGNOSIS — I10 Essential (primary) hypertension: Secondary | ICD-10-CM

## 2011-11-30 MED ORDER — TADALAFIL 2.5 MG PO TABS: 2.5000 mg | ORAL_TABLET | Freq: Every day | ORAL | Status: DC

## 2011-11-30 MED ORDER — VARDENAFIL HCL 10 MG PO TABS: 20.0000 mg | ORAL_TABLET | ORAL | Status: DC | PRN

## 2011-11-30 MED ORDER — FLUTICASONE PROPIONATE 50 MCG/ACT NA SUSP: 2.0000 | Freq: Every day | NASAL | Status: DC

## 2011-11-30 MED ORDER — AMLODIPINE BESYLATE 10 MG PO TABS: 10.0000 mg | ORAL_TABLET | Freq: Every day | ORAL | Status: DC

## 2011-11-30 MED ORDER — DIAZEPAM 2 MG PO TABS: 2.0000 mg | ORAL_TABLET | Freq: Every evening | ORAL | Status: DC | PRN

## 2011-11-30 MED ORDER — ESOMEPRAZOLE MAGNESIUM 40 MG PO CPDR: 40.0000 mg | DELAYED_RELEASE_CAPSULE | Freq: Every day | ORAL | Status: DC

## 2011-11-30 NOTE — Progress Notes (Signed)
  Subjective:    Patient ID: Jack Grimes, male    DOB: 02-15-1956, 56 y.o.   MRN: [id number]  HPI Patient presents to the clinic for his 6 month for blood pressure medications. He has had no problems in the last 6 months that he has reported today. He does not check blood pressure at home. Taking Norvasc 10mg  daily and doing well. Needs refill today.  He does want a refill on flonase that he got a couple of months ago. He had been using afrin but flonase helps better. He still has some allergies and congestion but flonase is helping a lot.   He also needs a refill on Nexium and Valium. He only uses valium as needed at night to help with sleep when he is too anxious. He states he uses it about 4 times a month. He only uses if he has too. He is doing well with anxiety but almost out of medication.  He still is having problems with ED. Levitria did help but he does not have prescription. He has also tried Viagra but he uses whatever is cheaper. He has not tried Cialis. He denies any urinary problems with frequency or weak stream.    Review of Systems     Objective:   Physical Exam  Constitutional: He is oriented to person, place, and time. He appears well-developed and well-nourished.  HENT:  Head: Normocephalic and atraumatic.  Cardiovascular: Normal rate, regular rhythm, normal heart sounds and intact distal pulses.        No edema in bilateral legs.  Pulmonary/Chest: Effort normal and breath sounds normal. He has no wheezes.  Neurological: He is alert and oriented to person, place, and time.  Skin: Skin is warm and dry.  Psychiatric: He has a normal mood and affect. His behavior is normal.          Assessment & Plan:  Hypertension- BP at goal less than140/90. Refilled Norvasc 10mg  daily for 6 months. Instructed patient that we needed blood work. He reports that he has had done recently and will get records. We also talked about the need for Lipid panel he will check and see if this  was done also.   Anxiety- Controlled. Uses Valium when needed for anxiety to help with sleep. Refilled for 30 pills times 1. This should last for 6 months.   GERD- refilled Nexium for 6 months.   Allergic Rhinitis- Assured patient that he is doing the right thing by stopping afrin because of rebound congestion. Refilled Flonase and advised that he might want to try an OTC anti-histamine like Zyrtec when spring comes and allergies get worse.  ED- [facility] prescription [facility] where patient states that should be 20 dollars a month. It worked well in the past. He wants to know if Cialis daily would work and be cheaper. I gave him prescription and card for 1 free month of Cialis to try if levitra was too expensive. Patient will let us know which one he wants prescribed regularly. He was instructed NOT to take two pills together.   Asked about tetanus and thinks he has had within 10 years will find out.

## 2011-11-30 NOTE — Patient Instructions (Addendum)
Continue Norvasc for blood pressure. Refilled Norvasc for 6 months along with nexium, flonase and Levitra. Please find blood work from other providers so that we can get a recent Lipid panel and CMP on file. Check when your last Tetanus shot was. Gave samples of Cialis to try if levitra is too expensive. Will call if working and will give you card.

## 2011-12-07 ENCOUNTER — Telehealth: Payer: Self-pay | Admitting: Nurse Practitioner

## 2011-12-07 NOTE — Telephone Encounter (Signed)
Per pt his insurance will only pay for his labs if they are completed by [facility].  Insurance will not pay for labs by [facility].  Spoke with Meriam Sprague, Personal assistant- ok to have patients labs drawn here and they will send to [facility] instead of [facility].  However, results will not be received electronically or same day.  Notified pt of above. Forwarded information to Dr. Clelia Croft.

## 2011-12-11 ENCOUNTER — Other Ambulatory Visit (HOSPITAL_BASED_OUTPATIENT_CLINIC_OR_DEPARTMENT_OTHER): Payer: BC Managed Care – PPO | Admitting: Lab

## 2011-12-11 ENCOUNTER — Ambulatory Visit (HOSPITAL_BASED_OUTPATIENT_CLINIC_OR_DEPARTMENT_OTHER): Payer: BC Managed Care – PPO | Admitting: Oncology

## 2011-12-11 VITALS — BP 148/87 | HR 78 | Temp 97.2°F | Ht 71.0 in | Wt 185.5 lb

## 2011-12-11 DIAGNOSIS — Z87898 Personal history of other specified conditions: Secondary | ICD-10-CM

## 2011-12-11 DIAGNOSIS — Z8546 Personal history of malignant neoplasm of prostate: Secondary | ICD-10-CM

## 2011-12-11 DIAGNOSIS — C61 Malignant neoplasm of prostate: Secondary | ICD-10-CM

## 2011-12-11 NOTE — Progress Notes (Signed)
Hematology and Oncology Follow Up Visit  Jack Grimes [id number] November 26, 1955 56 y.o. 12/11/2011 10:04 AM  CC: Seymour Bars, D.O. [facility] Lucrezia Starch. Earlene Plater, M.D.    Principle Diagnosis: A 56 year old gentleman with the following diagnoses. 1. 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  He has been in remission since 2006.  Treatment was done in [place], [place]. 2.     History of prostate cancer Gleason score 3 plus 3 equals 6.  PSA was around 1.3 diagnosed in 2003.  He has been on active surveillance.   Interim History:  Mr. Kempton presents today for a followup visit.  He has continued to be asymptomatic from the above problems as mentioned.  He had not reported any recent history.  Had not reported any recent complaints.  He does not report any abdominal pain.  He did not report any major changes in his performance status or activity level.  He did not report any urination difficulties.  Overall performance status and activity level remains excellent. He following up with Dr. Laverle Patter for his prostate cancer standpoint.   Medications: I have reviewed the patient's current medications. Current outpatient prescriptions:amLODipine (NORVASC) 10 MG tablet, Take 1 tablet (10 mg total) by mouth daily., Disp: 30 tablet, Rfl: 5;  aspirin 81 MG tablet, Take 81 mg by mouth daily.  , Disp: , Rfl: ;  diazepam (VALIUM) 2 MG tablet, Take 1 tablet (2 mg total) by mouth at bedtime as needed., Disp: 30 tablet, Rfl: 1;  esomeprazole (NEXIUM) 40 MG capsule, Take 1 capsule (40 mg total) by mouth daily before breakfast., Disp: 90 capsule, Rfl: 1 fluticasone (FLONASE) 50 MCG/ACT nasal spray, Place 2 sprays into the nose daily., Disp: 1 g, Rfl: 6;  Tadalafil 2.5 MG TABS, Take 1 tablet (2.5 mg total) by mouth daily., Disp: 30 each, Rfl: 5;  vardenafil (LEVITRA) 10 MG tablet, Take 2 tablets (20 mg total) by mouth as needed for erectile  dysfunction., Disp: 10 tablet, Rfl: 1  Allergies:  Allergies  Allergen Reactions  . Sulfonamide Derivatives     Past Medical History, Surgical history, Social history, and Family History were reviewed and updated.  Review of Systems: Constitutional:  Negative for fever, chills, night sweats, anorexia, weight loss, pain. Cardiovascular: no chest pain or dyspnea on exertion Respiratory: no cough, shortness of breath, or wheezing Neurological: no TIA or stroke symptoms Dermatological: negative ENT: negative Skin: Negative. Gastrointestinal: no abdominal pain, change in bowel habits, or black or bloody stools Genito-Urinary: no dysuria, trouble voiding, or hematuria Hematological and Lymphatic: negative Breast: negative Musculoskeletal: negative Remaining ROS negative. Physical Exam: Blood pressure 148/87, pulse 78, temperature 97.2 F (36.2 C), temperature source Oral, height 5\' 11"  (1.803 m), weight 185 lb 8 oz (84.142 kg). ECOG: 0 General appearance: alert Head: Normocephalic, without obvious abnormality, atraumatic Neck: no adenopathy, no carotid bruit, no JVD, supple, symmetrical, trachea midline and thyroid not enlarged, symmetric, no tenderness/mass/nodules Lymph nodes: Cervical, supraclavicular, and axillary nodes normal. Heart:regular rate and rhythm, S1, S2 normal, no murmur, click, rub or gallop Lung:chest clear, no wheezing, rales, normal symmetric air entry Abdomin: soft, non-tender, without masses or organomegaly EXT:no erythema, induration, or nodules   Lab Results: Lab Results  Component Value Date   WBC 6.7 04/28/2011   HGB 16.6 04/28/2011   HCT 46.6 04/28/2011   MCV 90.3 04/28/2011   PLT 192 04/28/2011     Chemistry  Component Value Date/Time   NA 139 04/28/2011 0938   NA 139 04/28/2011 0938   K 3.9 04/28/2011 0938   K 3.9 04/28/2011 0938   CL 104 04/28/2011 0938   CL 104 04/28/2011 0938   CO2 21 04/28/2011 0938   CO2 21 04/28/2011 0938   BUN 13  04/28/2011 0938   BUN 13 04/28/2011 0938   CREATININE 0.89 04/28/2011 0938   CREATININE 0.89 04/28/2011 0938      Component Value Date/Time   CALCIUM 9.5 04/28/2011 0938   CALCIUM 9.5 04/28/2011 0938   ALKPHOS 53 04/28/2011 0938   ALKPHOS 53 04/28/2011 0938   AST 11 04/28/2011 0938   AST 11 04/28/2011 0938   ALT 19 04/28/2011 0938   ALT 19 04/28/2011 0938   BILITOT 0.8 04/28/2011 0938   BILITOT 0.8 04/28/2011 0938      Impression and Plan: This is a 56 year old gentleman with the following issues. 1. History of follicular lymphoma, low grade, a stage 1E treated with external beam radiation.  Physical examination does not reveal any evidence to suggest recurrence. 2. History of prostate cancer, had a Gleason score of 3 plus 3 equals 6.  PSA is stable, have ranged between 1.3 and 1.5, he is asymptomatic.  He following up with Dr. Laverle Patter with active surveillance at this point.      [facility], MD 3/1/201310:04 AM

## 2011-12-15 ENCOUNTER — Telehealth: Payer: Self-pay | Admitting: *Deleted

## 2011-12-15 NOTE — Telephone Encounter (Signed)
Spoke with patient re:lab results done on 12/11/11. Per dr Clelia Croft.  Mailed copy to patient.

## 2011-12-16 ENCOUNTER — Telehealth: Payer: Self-pay | Admitting: *Deleted

## 2011-12-16 NOTE — Telephone Encounter (Signed)
Pt states that he has been taking the Cialis x 4 days and doesn't feel like it is working for him. Pt wants to know if he doesn't take any Cialis can he take a levitra Friday night. Pt ask if he could take more than 1 cialis at a time and I informed him not to. Please advise.

## 2011-12-16 NOTE — Telephone Encounter (Signed)
For daily dosing the max is 5mg  so I thin he is on the 2.5mg  dose so he can increase to 2 tabs daily and give it 3-4 more days on the 5mg  dose and see if working better.  If decides to use the levitra needs to wait 24 hours after last dose of cialis.

## 2011-12-16 NOTE — Telephone Encounter (Signed)
Pt informed

## 2012-01-26 ENCOUNTER — Other Ambulatory Visit: Payer: Self-pay | Admitting: *Deleted

## 2012-01-26 MED ORDER — VARDENAFIL HCL 20 MG PO TABS: 20.0000 mg | ORAL_TABLET | Freq: Every day | ORAL | Status: DC | PRN

## 2012-03-08 ENCOUNTER — Other Ambulatory Visit: Payer: Self-pay | Admitting: *Deleted

## 2012-03-08 MED ORDER — ESOMEPRAZOLE MAGNESIUM 40 MG PO CPDR: 40.0000 mg | DELAYED_RELEASE_CAPSULE | Freq: Every day | ORAL | Status: DC

## 2012-05-16 ENCOUNTER — Ambulatory Visit (INDEPENDENT_AMBULATORY_CARE_PROVIDER_SITE_OTHER): Payer: BC Managed Care – PPO | Admitting: Physician Assistant

## 2012-05-16 ENCOUNTER — Encounter: Payer: Self-pay | Admitting: Physician Assistant

## 2012-05-16 VITALS — BP 158/101 | HR 74 | Ht 71.0 in | Wt 180.0 lb

## 2012-05-16 DIAGNOSIS — I10 Essential (primary) hypertension: Secondary | ICD-10-CM

## 2012-05-16 DIAGNOSIS — Z23 Encounter for immunization: Secondary | ICD-10-CM

## 2012-05-16 DIAGNOSIS — F411 Generalized anxiety disorder: Secondary | ICD-10-CM

## 2012-05-16 DIAGNOSIS — F419 Anxiety disorder, unspecified: Secondary | ICD-10-CM

## 2012-05-16 MED ORDER — SERTRALINE HCL 50 MG PO TABS: 50.0000 mg | ORAL_TABLET | Freq: Every day | ORAL | Status: DC

## 2012-05-16 MED ORDER — METOPROLOL TARTRATE 50 MG PO TABS: 50.0000 mg | ORAL_TABLET | Freq: Two times a day (BID) | ORAL | Status: DC

## 2012-05-16 MED ORDER — AMLODIPINE BESYLATE 10 MG PO TABS: 10.0000 mg | ORAL_TABLET | Freq: Every day | ORAL | Status: DC

## 2012-05-16 MED ORDER — METOPROLOL SUCCINATE ER 25 MG PO TB24: 25.0000 mg | ORAL_TABLET | Freq: Every day | ORAL | Status: DC

## 2012-05-16 MED ORDER — ESOMEPRAZOLE MAGNESIUM 40 MG PO CPDR: 40.0000 mg | DELAYED_RELEASE_CAPSULE | Freq: Every day | ORAL | Status: DC

## 2012-05-16 MED ORDER — DIAZEPAM 2 MG PO TABS: 2.0000 mg | ORAL_TABLET | Freq: Every evening | ORAL | Status: DC | PRN

## 2012-05-16 NOTE — Progress Notes (Signed)
  Subjective:    Patient ID: Jack Grimes, male    DOB: 05-Feb-1956, 56 y.o.   MRN: [id number]  HPI Pt presents to the clinic to get tdap shot to complete work physical, to f/u on HTN, and to discuss anxiousness.   Pt was taking Norvasc 10mg  daily but felt like it wasn't doing anything so decrease to 5mg . BP is still very elevated. He cannot take ACE or ARB because of intolerances. He has not made a lot of diet changes and finds it really hard to exercise because of work schedule. Denies any CP, palpitations, SOB, or HA's.   His wife and daughter thinks he has ADD or ADHD because he is always doing something and has so many projects. Pt does not feel he has this. He reports that he does have a lot of responsibility and is going all the time but finishes his tasks and has no problem focusing. He does report he feels anxious all the time. His job is stressful and he has lot's of time constraints that make things stressful. He also feels like he worries a lot. If his wife doesn't pick up her phone he worries until he hears from her. He already takes Valium as needed. Valium does help but he does not use very often.     Review of Systems     Objective:   Physical Exam  Constitutional: He is oriented to person, place, and time. He appears well-developed and well-nourished.  HENT:  Head: Normocephalic and atraumatic.  Cardiovascular: Normal rate, regular rhythm and normal heart sounds.   Pulmonary/Chest: Effort normal and breath sounds normal. He has no wheezes.  Neurological: He is alert and oriented to person, place, and time.  Skin: Skin is warm and dry.  Psychiatric: He has a normal mood and affect. His behavior is normal.          Assessment & Plan:  Anxiety- GAD-7 was 3 and ADD 3/18 screening test. He tested negative for anxiety and ADHD. Based on presentation today and history I think we should try SSRI. Start Zoloft daily. I do not think he has ADHD and could not give stimulant either  way until bp decreases. If Zoloft does help we could always get formal evaluation of AdhD. Recheck in 1 month.  HTN-Norvasc daily go back to 10mg  daily. Start TOprol Xl 25mg  once a day. Continue working on salt intake.since blood pressure has been hard to control will order renal ultrasound. Continue to monitor blood pressure. Will recheck in 1 month. Needs CmP at next visit. Ordered recently but not gone to get.     Tdap given today.

## 2012-05-16 NOTE — Patient Instructions (Addendum)
Norvasc daily go back to 10mg  daily. Start Metoprolol once a day. Continue working on salt intake. Zoloft 1/2 tab for 7 days then increase to one tab daily. Exercise regularly. Will recheck blood pressure. Will schedule renal ultrasound. Needs bloodwork.

## 2012-05-23 ENCOUNTER — Ambulatory Visit: Payer: BC Managed Care – PPO | Admitting: Family Medicine

## 2012-06-10 ENCOUNTER — Other Ambulatory Visit: Payer: Self-pay | Admitting: *Deleted

## 2012-06-10 MED ORDER — VARDENAFIL HCL 20 MG PO TABS: 20.0000 mg | ORAL_TABLET | Freq: Every day | ORAL | Status: DC | PRN

## 2012-06-15 ENCOUNTER — Ambulatory Visit: Payer: BC Managed Care – PPO | Admitting: Physician Assistant

## 2012-06-17 ENCOUNTER — Ambulatory Visit: Payer: BC Managed Care – PPO | Admitting: Physician Assistant

## 2012-06-20 ENCOUNTER — Other Ambulatory Visit: Payer: Self-pay | Admitting: *Deleted

## 2012-06-20 ENCOUNTER — Encounter: Payer: Self-pay | Admitting: Physician Assistant

## 2012-06-20 ENCOUNTER — Telehealth: Payer: Self-pay | Admitting: Physician Assistant

## 2012-06-20 ENCOUNTER — Ambulatory Visit (INDEPENDENT_AMBULATORY_CARE_PROVIDER_SITE_OTHER): Payer: BC Managed Care – PPO | Admitting: Physician Assistant

## 2012-06-20 VITALS — BP 138/90 | HR 72 | Ht 71.0 in | Wt 182.0 lb

## 2012-06-20 DIAGNOSIS — Z131 Encounter for screening for diabetes mellitus: Secondary | ICD-10-CM

## 2012-06-20 DIAGNOSIS — I10 Essential (primary) hypertension: Secondary | ICD-10-CM

## 2012-06-20 DIAGNOSIS — F419 Anxiety disorder, unspecified: Secondary | ICD-10-CM

## 2012-06-20 DIAGNOSIS — Z13220 Encounter for screening for lipoid disorders: Secondary | ICD-10-CM

## 2012-06-20 DIAGNOSIS — Z79899 Other long term (current) drug therapy: Secondary | ICD-10-CM

## 2012-06-20 DIAGNOSIS — F411 Generalized anxiety disorder: Secondary | ICD-10-CM

## 2012-06-20 MED ORDER — METOPROLOL SUCCINATE ER 25 MG PO TB24: 50.0000 mg | ORAL_TABLET | Freq: Every day | ORAL | Status: DC

## 2012-06-20 MED ORDER — SERTRALINE HCL 50 MG PO TABS: 50.0000 mg | ORAL_TABLET | Freq: Every day | ORAL | Status: DC

## 2012-06-20 NOTE — Telephone Encounter (Signed)
Patient called advised that his pharmacy is [facility] in [place] not [facility]. But the prescriptions that were sent today the pharmacy at [facility] was able to send those to [facility]. He just wanted to advise our office for futrue refills-vew

## 2012-06-20 NOTE — Patient Instructions (Addendum)
Increase Toprol XL to 50mg  daily. Continue on Norvasc. Refilled Zoloft 50mg  daily. Consider testing formally for ADHD.

## 2012-06-20 NOTE — Progress Notes (Signed)
Subjective:    Patient ID: Jack Grimes, male    DOB: Feb 18, 1956, 56 y.o.   MRN: [id number]  HPI Patient presents for one-month followup own anxiety/possible ADHD. I started him on Zoloft 25 mg to increase to 50 mg after the first week. Patient didn't start the medication for around one week and went off of it for a few days then started the medication back. He has not increase to 50 mg dosage. He did not collect this has been very beneficial in helping him with his symptoms. His wife and daughter are very adamant that he has ADHD. He does not necessarily think that this is problem. 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 He feels like when he gets home he does just want to go to his room in the himself. He is sometimes grumpy when he comes home from a long trip but he is frustrated because his wife and daughter always complained about his mood. He denies any depression or feelings of helplessness or hopelessness. He denies any suicidal thoughts. He feels like his symptoms are mostly do to anxiety with all of his job responsibilities, home life, bills, decisions that he has to make.  His blood pressure is doing much better today than his previous visit; however, his blood pressure is still uncontrolled. He is on Norvasc 10 mg and Toprol-XL 25 mg daily. He denies any chest pain, palpitations, shortness of breath, numbness and tingling of extremities. He has had occasional dark spots come up in his vision after drinking a few beers or standing up. He has not checked his blood pressure outside of our office. He denies any exercise but has been trying to watch his diet and salt intake.   Review of Systems     Objective:   Physical Exam  Constitutional: He is oriented to person, place, and time. He appears well-developed and well-nourished.  HENT:  Head: Normocephalic and atraumatic.  Cardiovascular: Normal rate, regular rhythm and normal heart sounds.     Pulmonary/Chest: Effort normal and breath sounds normal.  Neurological: He is alert and oriented to person, place, and time.  Skin: Skin is warm and dry.  Psychiatric: He has a normal mood and affect. His behavior is normal.          Assessment & Plan:  Anxiety-we discussed in detail anxiety versus ADHD. We have done a previous screening for ADHD in office which was negative. Since his wife and daughter are pressuring him to be formally evaluated I do not think this is unreasonable. Patient does not want to be formally evaluated at this time. I also discussed with him that he's not on a therapeutic level of Zoloft and has not taken it regularly for at least 6 weeks. Patient agrees to increase Zoloft to 50 mg tab daily and taking it regularly for the next 6 weeks. If he does not fully this is helping with his symptoms at all I recommend he call the office and we'll get him scheduled for ADHD testing with Dr. Dellia Cloud. I did mention with patient that seems like a lot of stress is coming from his relationship with his wife. I believe that some type of counseling between husband and wife would be beneficial with his symptoms. I would like for patient to followup with me in the office at least 2 months. If he does need formal evaluation to a cleft evaluation to start any ADHD stimulant therapy.  I also discussed with patient that starting stimulants would mean his blood pressure would have to be controlled. I did tell patient to watch alcohol consumption and Zoloft.  Hypertension-I did increase his Toprol-XL to 50 mg daily. Patient is to stay on Norvasc 10 mg. I do not think the dizziness and black spots is coming from blood pressure medication. I did tell him to watch out for worsening symptoms and alert and he is any changes happen. Encouraged him to check blood pressure outside of office more frequently to see where he's running outside of the office. Patient is not on lisinopril/HCTZ due  to an  intolerance with calf swelling and pain. I will record this and hence allergies section. I would like to recheck in 2 months.  Patient needs to have fasting lipids and CMP completed. Patient was given lab slip to have blood drawn. We will get back to patient with results.  I did spend 25 minutes with patient greater than 50% of visit reassuring him about plan for anxiety versus ADHD.

## 2012-06-21 ENCOUNTER — Telehealth: Payer: Self-pay | Admitting: Oncology

## 2012-06-21 NOTE — Telephone Encounter (Signed)
Pt called to r/s 9/11 appt to 9/25. Pt has new d/t.

## 2012-06-22 ENCOUNTER — Ambulatory Visit: Payer: BC Managed Care – PPO | Admitting: Oncology

## 2012-06-22 ENCOUNTER — Other Ambulatory Visit: Payer: BC Managed Care – PPO | Admitting: Lab

## 2012-06-28 ENCOUNTER — Telehealth: Payer: Self-pay | Admitting: *Deleted

## 2012-06-28 NOTE — Telephone Encounter (Signed)
LMOM for Pt to schedule OV to be evaluated today.

## 2012-06-28 NOTE — Telephone Encounter (Signed)
Pt started sertraline at last OV and has noticed that his uvula is longer than normal and is causing him to feel like he has something stuck to the back of his throat. Pt would like to know if this caused by the meds or if something else could be going on. Please advise.

## 2012-06-28 NOTE — Telephone Encounter (Signed)
Not sure this is from the med. Can he come in and have one of the docs look at it. No problems swallowing or breathing.

## 2012-07-04 ENCOUNTER — Other Ambulatory Visit: Payer: Self-pay | Admitting: *Deleted

## 2012-07-04 MED ORDER — METOPROLOL SUCCINATE ER 25 MG PO TB24: 50.0000 mg | ORAL_TABLET | Freq: Every day | ORAL | Status: DC

## 2012-07-04 MED ORDER — SERTRALINE HCL 50 MG PO TABS: 50.0000 mg | ORAL_TABLET | Freq: Every day | ORAL | Status: DC

## 2012-07-06 ENCOUNTER — Telehealth: Payer: Self-pay | Admitting: Oncology

## 2012-07-06 ENCOUNTER — Other Ambulatory Visit (HOSPITAL_BASED_OUTPATIENT_CLINIC_OR_DEPARTMENT_OTHER): Payer: BC Managed Care – PPO | Admitting: Lab

## 2012-07-06 ENCOUNTER — Ambulatory Visit (HOSPITAL_BASED_OUTPATIENT_CLINIC_OR_DEPARTMENT_OTHER): Payer: BC Managed Care – PPO | Admitting: Oncology

## 2012-07-06 VITALS — BP 131/85

## 2012-07-06 DIAGNOSIS — Z87898 Personal history of other specified conditions: Secondary | ICD-10-CM

## 2012-07-06 DIAGNOSIS — C61 Malignant neoplasm of prostate: Secondary | ICD-10-CM

## 2012-07-06 DIAGNOSIS — Z8546 Personal history of malignant neoplasm of prostate: Secondary | ICD-10-CM

## 2012-07-06 LAB — BASIC METABOLIC PANEL
BUN: 9 mg/dL (ref 4–21)
Creatinine: 0.9 mg/dL (ref 0.6–1.3)
Potassium: 3.9 mmol/L (ref 3.4–5.3)
Sodium: 138 mmol/L (ref 137–147)

## 2012-07-06 LAB — HEPATIC FUNCTION PANEL
AST: 10 U/L — AB (ref 14–40)
Bilirubin, Total: 1.1 mg/dL

## 2012-07-06 LAB — COMPREHENSIVE METABOLIC PANEL (CC13)
ALT: 21 U/L (ref 0–55)
AST: 10 U/L (ref 5–34)
Alkaline Phosphatase: 67 U/L (ref 40–150)
BUN: 9 mg/dL (ref 7.0–26.0)
Chloride: 106 mEq/L (ref 98–107)
Creatinine: 0.9 mg/dL (ref 0.7–1.3)
Total Bilirubin: 1.1 mg/dL (ref 0.20–1.20)

## 2012-07-06 LAB — CBC WITH DIFFERENTIAL/PLATELET
BASO%: 1.5 % (ref 0.0–2.0)
EOS%: 2.7 % (ref 0.0–7.0)
HCT: 46.3 % (ref 38.4–49.9)
LYMPH%: 29.1 % (ref 14.0–49.0)
MCH: 32 pg (ref 27.2–33.4)
MCHC: 35.5 g/dL (ref 32.0–36.0)
MCV: 90 fL (ref 79.3–98.0)
MONO%: 7.8 % (ref 0.0–14.0)
NEUT%: 58.9 % (ref 39.0–75.0)
lymph#: 1.7 10*3/uL (ref 0.9–3.3)

## 2012-07-06 NOTE — Telephone Encounter (Signed)
Gave pt appt for 1 year MD and lab appt in September 2014

## 2012-07-06 NOTE — Progress Notes (Signed)
Hematology and Oncology Follow Up Visit  Jack Grimes [id number] 1956-03-25 56 y.o. 07/06/2012 9:22 AM  CC: Seymour Bars, D.O. [facility] Lucrezia Starch. Earlene Plater, M.D.    Principle Diagnosis: A 56 year old gentleman with the following diagnoses. 1. 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  He has been in remission since 2006.  Treatment was done in [place], [place]. 2.     History of prostate cancer Gleason score 3 plus 3 equals 6.  PSA was around 1.3 diagnosed in 2003.  He has been on active surveillance.   Interim History:  Mr. Riemenschneider presents today for a followup visit.  He has continued to be asymptomatic from the above problems as mentioned.  He had not reported any recent history.  Had not reported any recent complaints.  He does not report any abdominal pain.  He did not report any major changes in his performance status or activity level.  He did not report any urination difficulties.  Overall performance status and activity level remains excellent. He following up with Dr. Laverle Patter for his prostate cancer standpoint.   Medications: I have reviewed the patient's current medications. Current outpatient prescriptions:amLODipine (NORVASC) 10 MG tablet, Take 1 tablet (10 mg total) by mouth daily., Disp: 30 tablet, Rfl: 5;  aspirin 81 MG tablet, Take 81 mg by mouth daily.  , Disp: , Rfl: ;  diazepam (VALIUM) 2 MG tablet, Take 1 tablet (2 mg total) by mouth at bedtime as needed., Disp: 30 tablet, Rfl: 1;  esomeprazole (NEXIUM) 40 MG capsule, Take 1 capsule (40 mg total) by mouth daily before breakfast., Disp: 90 capsule, Rfl: 1 fluticasone (FLONASE) 50 MCG/ACT nasal spray, Place 2 sprays into the nose daily., Disp: 1 g, Rfl: 6;  metoprolol succinate (TOPROL-XL) 25 MG 24 hr tablet, Take 2 tablets (50 mg total) by mouth daily., Disp: 180 tablet, Rfl: 0;  sertraline (ZOLOFT) 50 MG tablet, Take 1 tablet (50 mg total) by  mouth daily., Disp: 90 tablet, Rfl: 0;  Tadalafil 2.5 MG TABS, Take 1 tablet (2.5 mg total) by mouth daily., Disp: 30 each, Rfl: 5 vardenafil (LEVITRA) 20 MG tablet, Take 1 tablet (20 mg total) by mouth daily as needed for erectile dysfunction., Disp: 5 tablet, Rfl: 0  Allergies:  Allergies  Allergen Reactions  . Lisinopril-Hydrochlorothiazide     Calf swelling.  . Sulfonamide Derivatives     Past Medical History, Surgical history, Social history, and Family History were reviewed and updated.  Review of Systems: Constitutional:  Negative for fever, chills, night sweats, anorexia, weight loss, pain. Cardiovascular: no chest pain or dyspnea on exertion Respiratory: no cough, shortness of breath, or wheezing Neurological: no TIA or stroke symptoms Dermatological: negative ENT: negative Skin: Negative. Gastrointestinal: no abdominal pain, change in bowel habits, or black or bloody stools Genito-Urinary: no dysuria, trouble voiding, or hematuria Hematological and Lymphatic: negative Breast: negative Musculoskeletal: negative Remaining ROS negative. Physical Exam: Blood pressure 131/85. ECOG: 0 General appearance: alert Head: Normocephalic, without obvious abnormality, atraumatic Neck: no adenopathy, no carotid bruit, no JVD, supple, symmetrical, trachea midline and thyroid not enlarged, symmetric, no tenderness/mass/nodules Lymph nodes: Cervical, supraclavicular, and axillary nodes normal. Heart:regular rate and rhythm, S1, S2 normal, no murmur, click, rub or gallop Lung:chest clear, no wheezing, rales, normal symmetric air entry Abdomin: soft, non-tender, without masses or organomegaly EXT:no erythema, induration, or nodules   Lab Results: Lab Results  Component Value Date   WBC 5.8  07/06/2012   HGB 16.4 07/06/2012   HCT 46.3 07/06/2012   MCV 90.0 07/06/2012   PLT 202 07/06/2012           Impression and Plan: This is a 56 year old gentleman with the following  issues. 1. History of follicular lymphoma, low grade, a stage 1E treated with external beam radiation.  Physical examination does not reveal any evidence to suggest recurrence. 2. History of prostate cancer, had a Gleason score of 3 plus 3 equals 6.  PSA is stable, have ranged between 1.3 and 1.5, he is asymptomatic.  He following up with Dr. Laverle Patter with active surveillance at this point.  3. Follow up: in one year.      [facility], MD 9/25/20139:22 AM

## 2012-07-20 ENCOUNTER — Ambulatory Visit (INDEPENDENT_AMBULATORY_CARE_PROVIDER_SITE_OTHER): Payer: BC Managed Care – PPO | Admitting: Physician Assistant

## 2012-07-20 ENCOUNTER — Encounter: Payer: Self-pay | Admitting: Physician Assistant

## 2012-07-20 VITALS — BP 123/82 | HR 69 | Ht 71.0 in | Wt 183.0 lb

## 2012-07-20 DIAGNOSIS — K1370 Unspecified lesions of oral mucosa: Secondary | ICD-10-CM

## 2012-07-20 DIAGNOSIS — R7309 Other abnormal glucose: Secondary | ICD-10-CM

## 2012-07-20 DIAGNOSIS — F411 Generalized anxiety disorder: Secondary | ICD-10-CM

## 2012-07-20 DIAGNOSIS — R739 Hyperglycemia, unspecified: Secondary | ICD-10-CM

## 2012-07-20 DIAGNOSIS — F419 Anxiety disorder, unspecified: Secondary | ICD-10-CM

## 2012-07-20 DIAGNOSIS — K1379 Other lesions of oral mucosa: Secondary | ICD-10-CM

## 2012-07-20 NOTE — Progress Notes (Signed)
Subjective:    Patient ID: Jack Grimes, male    DOB: January 04, 1956, 56 y.o.   MRN: [id number]  HPI Patient presents to the clinic to discuss increase in fasting glucose, follow up on anxiety, and to take about swollen uvula.  Brings in labs from oncologist that he ordered. CBC and CMP look good except elevated glucose. He did admit to having sugar in his coffee before blood draw. He is concerned about diabetes because his grandfather died from diabetes. 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   Anxiety is doing much better. His whole family has noticed a difference in his mood. They fill he is not "all over the place" as much. He reports he feels calmer. Denies any side effects. He is able to sleep.  For the last 2 weeks he has noticed that his uvula is swollen. He feels it in the back of his throat and it feels like it touches and gets caught places. He has even woken up in the middle of the night coughing feeling like he is choking on it. He denies any ST, sinus pressure, fever, chills, muscle aches, ear pain or throat drainage. He does cough some but no difficultly swallowing. He has been a little more raspy lately. Has not tried anything to make better and nothing makes worse and does not appear to be growing.   Review of Systems     Objective:   Physical Exam  Constitutional: He is oriented to person, place, and time. He appears well-developed and well-nourished.  HENT:  Head: Normocephalic and atraumatic.  Mouth/Throat: Oropharynx is clear and moist. No oropharyngeal exudate.       Uvula appears to be elongated and sticking to the back of his oropharynx. If I ask him to say ahh the attachement does come off the oropharynx and hang. Tonsils are not swollen or red, do not see any exudate.  Eyes: Conjunctivae normal are normal.  Neck: Normal range of motion. Neck supple.  Cardiovascular: Normal rate, regular rhythm and normal heart sounds.   Pulmonary/Chest: Effort  normal and breath sounds normal. He has no wheezes.  Lymphadenopathy:    He has no cervical adenopathy.  Neurological: He is alert and oriented to person, place, and time.  Skin: Skin is warm and dry.  Psychiatric: He has a normal mood and affect. His behavior is normal.          Assessment & Plan:  Elevated fasting glucose- HgA1c today is 5.0. Reassured patient that he is not in the diabetic range and to keep healthy balanced diet and regular exercise and will continue to monitor yearly.  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 Will refer to ENT because of symptoms and see if they need to remove it of what the treatment might be. Encouraged him to try allergy medication along with decongestant for the next couple of days and see if it helps with symptoms.   Anxiety- Encouraged patient to increase Zoloft to 100mg . He is doing much better on 50mg  but since he still feels very anxious I think he will benefit from increase. He is not ready to do so at this time so will continue 50 and see if there are any changes. Reminded him that increasing we still need time to take affect so give it at least 2 weeks. Follow up in 3 months. He has not seen Dr. Dellia Cloud yet and wants to wait on appt.  Declines flu shot.

## 2012-07-20 NOTE — Patient Instructions (Addendum)
Discussed going up on Zoloft and see if you get some extra benefit.   Will refer to ENT. STart taking Allegra D or allergy medicine with decongestant.

## 2012-07-21 ENCOUNTER — Encounter: Payer: Self-pay | Admitting: *Deleted

## 2012-08-16 ENCOUNTER — Other Ambulatory Visit: Payer: Self-pay | Admitting: Physician Assistant

## 2012-09-01 ENCOUNTER — Other Ambulatory Visit: Payer: Self-pay | Admitting: Physician Assistant

## 2012-09-05 ENCOUNTER — Ambulatory Visit: Payer: BC Managed Care – PPO | Admitting: Physician Assistant

## 2012-09-20 ENCOUNTER — Telehealth: Payer: Self-pay | Admitting: *Deleted

## 2012-09-20 NOTE — Telephone Encounter (Signed)
Patient calling to ask if we had rec'd his chart from [place]? No, his primary or his urologist may have his records.

## 2012-09-26 ENCOUNTER — Ambulatory Visit (INDEPENDENT_AMBULATORY_CARE_PROVIDER_SITE_OTHER): Payer: BC Managed Care – PPO | Admitting: Physician Assistant

## 2012-09-26 ENCOUNTER — Encounter: Payer: Self-pay | Admitting: Physician Assistant

## 2012-09-26 VITALS — BP 125/82 | HR 65 | Resp 18 | Wt 186.0 lb

## 2012-09-26 DIAGNOSIS — I10 Essential (primary) hypertension: Secondary | ICD-10-CM

## 2012-09-26 DIAGNOSIS — F411 Generalized anxiety disorder: Secondary | ICD-10-CM

## 2012-09-26 DIAGNOSIS — K1370 Unspecified lesions of oral mucosa: Secondary | ICD-10-CM

## 2012-09-26 DIAGNOSIS — K1379 Other lesions of oral mucosa: Secondary | ICD-10-CM

## 2012-09-26 DIAGNOSIS — F419 Anxiety disorder, unspecified: Secondary | ICD-10-CM

## 2012-09-26 DIAGNOSIS — Z7251 High risk heterosexual behavior: Secondary | ICD-10-CM

## 2012-09-26 MED ORDER — VARDENAFIL HCL 20 MG PO TABS: 20.0000 mg | ORAL_TABLET | Freq: Every day | ORAL | Status: DC | PRN

## 2012-09-26 MED ORDER — MAGIC MOUTHWASH: 5.0000 mL | Freq: Four times a day (QID) | ORAL | Status: DC

## 2012-09-26 NOTE — Progress Notes (Signed)
  Subjective:    Patient ID: Jack Grimes, male    DOB: 1955-11-09, 56 y.o.   MRN: [id number]  HPI Patient presents to the clinic to follow up on elongated uvula. He was not impressed with Dr. Clearance Coots ENT. He did not think there was a through evaluation of problem. Doctor stated that he could cut uvula down but would be very painful. Pt felt like he would like 2nd opinion. He still is very irritated with the constant sensation of uvula on back of throat and touching tongue.   BP looks great. He is taking 1 full norvasc tab and metroprolol 1/2 tab. Feels great.   He is also only taking 25mg  of zoloft. I can tell he is more anxious today than was at last appt. He is very worried about uvula and admits he felt better on higher dose. When he is at higher dose he does not feel so mch all over the place.   Pt is worried today about previous oncology office not having his HIV and sexually transmitted disease lab work. Would like to have redone today. He has not been unfaithful but cannot completely rule out his wife. He has had lymphoma and wants to make sure HIV is not the cause.   Review of Systems     Objective:   Physical Exam  Constitutional: He is oriented to person, place, and time. He appears well-developed and well-nourished.  HENT:  Head: Normocephalic and atraumatic.  Cardiovascular: Normal rate, regular rhythm and normal heart sounds.   Pulmonary/Chest: Effort normal and breath sounds normal.  Neurological: He is alert and oriented to person, place, and time.  Skin: Skin is warm and dry.  Psychiatric: He has a normal mood and affect. His behavior is normal.          Assessment & Plan:  Elongated uvula-Gave mouthwash to use for next week or so to help with symptoms. If no improvement will get 2nd opinion. I suggested that if still bothering him to have removed.   HTN- Stay on same dose of BP meds. Follow up in next 3months.   Anxiety- Increase to full tab of Zoloft. Follow up as  needed.  High risk sexual behavior/history of lymphoma- HIV and complete STD panel. Reassured pt that i do not suspect HIV was the cause of lymphoma.  25 minutes was spent with the patient and 50 percent of counseling given to reassure the patient of his medical conditions and treatment plan.

## 2012-09-26 NOTE — Patient Instructions (Addendum)
Will give mouthwash to see if decrease size. If not call back and will consider 2nd opinion.   Increase to 1 full tab of Zoloft.

## 2012-10-21 ENCOUNTER — Telehealth: Payer: Self-pay | Admitting: Physician Assistant

## 2012-10-21 NOTE — Telephone Encounter (Signed)
Pt called and has not received his labs from [facility]. Will you please call and see if we can get results from labs order 09/26/12? Pt is very anxious.

## 2012-10-24 ENCOUNTER — Ambulatory Visit (INDEPENDENT_AMBULATORY_CARE_PROVIDER_SITE_OTHER): Payer: BC Managed Care – PPO | Admitting: Physician Assistant

## 2012-10-24 ENCOUNTER — Encounter: Payer: Self-pay | Admitting: Physician Assistant

## 2012-10-24 VITALS — BP 140/92 | HR 67 | Wt 188.0 lb

## 2012-10-24 DIAGNOSIS — K1379 Other lesions of oral mucosa: Secondary | ICD-10-CM

## 2012-10-24 DIAGNOSIS — F411 Generalized anxiety disorder: Secondary | ICD-10-CM

## 2012-10-24 DIAGNOSIS — K1370 Unspecified lesions of oral mucosa: Secondary | ICD-10-CM

## 2012-10-24 DIAGNOSIS — I10 Essential (primary) hypertension: Secondary | ICD-10-CM

## 2012-10-24 MED ORDER — DIAZEPAM 2 MG PO TABS: 2.0000 mg | ORAL_TABLET | Freq: Every evening | ORAL | Status: DC | PRN

## 2012-10-24 NOTE — Patient Instructions (Signed)
Call if want 2nd opinion to ENT.

## 2012-10-24 NOTE — Telephone Encounter (Signed)
Called [facility] at [phone] and they should be faxing you the lab results.

## 2012-10-24 NOTE — Progress Notes (Signed)
  Subjective:    Patient ID: Jack Grimes, male    DOB: 04-Nov-1955, 57 y.o.   MRN: [id number]  HPI Patient is a 57 yo male who presents to the clinic to follow up on Anxiety and ongoing elongated uvula.   Patient has been taking full tab of zoloft of 50 mg and feels much more calm. His wife still complains that he runs around all the time. Pt reports that he has a lot to do but gets a lot accomplished. He does not want to go up on Zoloft.   He continues to have feel uvula tickle the back of his throat all throughout the day. NO pain. It is worse at night when he is trying to sleep. It has caused him to gag.The magic mouthwash does seem to help. He did not like ENT that he first met and if he decides to get removed would like to see another ENT.   BP was elevated today but did not take BP medications this morning because he likes to take with food. He denies any CP, palpitations, numbness and tingling, or HA.    Review of Systems  Constitutional: Negative.   Respiratory: Negative.   Cardiovascular: Negative.   Neurological: Negative.        Objective:   Physical Exam  Constitutional: He is oriented to person, place, and time. He appears well-developed and well-nourished.  HENT:  Head: Normocephalic and atraumatic.       Uvula still elongated. No swelling or redness.  Cardiovascular: Normal rate, regular rhythm and normal heart sounds.   Pulmonary/Chest: Effort normal and breath sounds normal.  Neurological: He is alert and oriented to person, place, and time.  Skin: Skin is warm and dry.  Psychiatric: He has a normal mood and affect. His behavior is normal.          Assessment & Plan:  GAD- GAD-7 was 2. Will keep zoloft at same dose. Follow up as needed. Discussed that there are other options for anxiety to add or switch too. If willing to consider follow up.   Elongated Uvula- continue to use magic mouthwash. If not improving and still bothering him significantly in next week we  will put in referral for ENT Dr. Narda Bonds. Let us know when you are ready.  HTN- BP meds not taken today. Continue on therapy. Reminded of low salt.

## 2012-11-02 ENCOUNTER — Encounter: Payer: Self-pay | Admitting: *Deleted

## 2012-12-06 ENCOUNTER — Other Ambulatory Visit: Payer: Self-pay | Admitting: *Deleted

## 2012-12-06 MED ORDER — METOPROLOL SUCCINATE ER 25 MG PO TB24: 50.0000 mg | ORAL_TABLET | Freq: Every day | ORAL | Status: DC

## 2012-12-16 ENCOUNTER — Other Ambulatory Visit: Payer: Self-pay | Admitting: Physician Assistant

## 2012-12-19 ENCOUNTER — Telehealth: Payer: Self-pay

## 2012-12-19 NOTE — Telephone Encounter (Signed)
Noriel stepped on a branch yesterday, 12/18/2012, and the branch went through his boot and foot. The area is painful but no noticeable sign of swelling or redness. Last T-dap 05/16/2012. I advised him he needs to come in for an appointment. 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 I advised him to go to a local urgent care or ED for treatment. He stated he did not want to do that and he wanted to make an appointment for Wednesday when he returns. I scheduled the appointment and advised him this is not the best medical care to leave the puncture wound until Wednesday. He still wants to wait. I advised him to go to a urgent care or ED immediately if the area becomes red, swollen or he gets fever, chills or sweats.

## 2012-12-19 NOTE — Telephone Encounter (Signed)
Left detailed message.   

## 2012-12-19 NOTE — Telephone Encounter (Signed)
Please also call pt and tell him to get hibclens OTC and clean wound for the next 2 days to prevent infection.

## 2012-12-21 ENCOUNTER — Ambulatory Visit: Payer: BC Managed Care – PPO | Admitting: Physician Assistant

## 2012-12-28 ENCOUNTER — Telehealth: Payer: Self-pay | Admitting: *Deleted

## 2012-12-28 MED ORDER — NYSTATIN-TRIAMCINOLONE 100000-0.1 UNIT/GM-% EX OINT: TOPICAL_OINTMENT | Freq: Two times a day (BID) | CUTANEOUS | Status: DC | PRN

## 2012-12-28 MED ORDER — AMLODIPINE BESYLATE 10 MG PO TABS: ORAL_TABLET | ORAL | Status: DC

## 2012-12-28 NOTE — Telephone Encounter (Signed)
rx sent to pharmacy

## 2012-12-28 NOTE — Telephone Encounter (Signed)
Patient calls and would like to get a refill on the triamcinolone/Nystatin ointment that he uses between his buttocks for dry patches. 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

## 2013-01-23 ENCOUNTER — Encounter: Payer: Self-pay | Admitting: Physician Assistant

## 2013-01-23 ENCOUNTER — Ambulatory Visit (INDEPENDENT_AMBULATORY_CARE_PROVIDER_SITE_OTHER): Payer: BC Managed Care – PPO | Admitting: Physician Assistant

## 2013-01-23 VITALS — BP 129/79 | HR 80 | Wt 189.0 lb

## 2013-01-23 DIAGNOSIS — Z131 Encounter for screening for diabetes mellitus: Secondary | ICD-10-CM

## 2013-01-23 DIAGNOSIS — F411 Generalized anxiety disorder: Secondary | ICD-10-CM

## 2013-01-23 DIAGNOSIS — N400 Enlarged prostate without lower urinary tract symptoms: Secondary | ICD-10-CM | POA: Insufficient documentation

## 2013-01-23 DIAGNOSIS — Z13220 Encounter for screening for lipoid disorders: Secondary | ICD-10-CM

## 2013-01-23 DIAGNOSIS — N529 Male erectile dysfunction, unspecified: Secondary | ICD-10-CM | POA: Insufficient documentation

## 2013-01-23 DIAGNOSIS — I10 Essential (primary) hypertension: Secondary | ICD-10-CM

## 2013-01-23 DIAGNOSIS — K1379 Other lesions of oral mucosa: Secondary | ICD-10-CM | POA: Insufficient documentation

## 2013-01-23 DIAGNOSIS — K1370 Unspecified lesions of oral mucosa: Secondary | ICD-10-CM

## 2013-01-23 MED ORDER — METOPROLOL SUCCINATE ER 50 MG PO TB24: 50.0000 mg | ORAL_TABLET | Freq: Every day | ORAL | Status: AC

## 2013-01-23 MED ORDER — MAGIC MOUTHWASH: 5.0000 mL | Freq: Four times a day (QID) | ORAL | Status: DC

## 2013-01-23 MED ORDER — VARDENAFIL HCL 20 MG PO TABS: 20.0000 mg | ORAL_TABLET | Freq: Every day | ORAL | Status: DC | PRN

## 2013-01-23 MED ORDER — SERTRALINE HCL 50 MG PO TABS: 50.0000 mg | ORAL_TABLET | Freq: Every day | ORAL | Status: DC

## 2013-01-23 NOTE — Progress Notes (Signed)
  Subjective:    Patient ID: Jack Grimes, male    DOB: 29-Oct-1955, 57 y.o.   MRN: [id number]  HPI Patient presents to the clinic for 3 month follow up and med refill.   HTN- controlled. Needs refill. Trying to make better diet choices. Denies any CP, palpitations, SOB, HA's.   ED/BPH well controlled. Insurance will not pay for cialis. [place] [facility] does work for ED needs refills.   Anxiety- well controlled on Zoloft. Feels great. No complians or concerns. Feels a lot less anxious as previously.   Elongated uvula- controlled really feels like mouthwash decreases size of uvula so uses it on a regular basis. Not bothering him as much as it used too.    Review of Systems  All other systems reviewed and are negative.       Objective:   Physical Exam  Constitutional: He is oriented to person, place, and time. He appears well-developed and well-nourished.  HENT:  Head: Normocephalic and atraumatic.  Eyes: Conjunctivae are normal.  Neck: Normal range of motion. Neck supple. No thyromegaly present.  Cardiovascular: Normal rate, regular rhythm and normal heart sounds.   Pulmonary/Chest: Effort normal and breath sounds normal.  Lymphadenopathy:    He has no cervical adenopathy.  Neurological: He is alert and oriented to person, place, and time.  Skin: Skin is warm and dry.  Psychiatric: He has a normal mood and affect. His behavior is normal.          Assessment & Plan:  HTN- metoprolol refilled for 90 supply. Norvasc did not need refilled. Reminded of low salt diet.   GAD- Refilled zoloft.   ED/BPH- Refilled levitra. Symptoms of BPH are not severe enough to need treatment at this time per patient.   Elongated uvula- Miracle mouthwash as needed. Refilled.

## 2013-02-08 ENCOUNTER — Other Ambulatory Visit: Payer: Self-pay | Admitting: Physician Assistant

## 2013-02-10 ENCOUNTER — Encounter: Payer: Self-pay | Admitting: Sports Medicine

## 2013-02-10 ENCOUNTER — Ambulatory Visit (INDEPENDENT_AMBULATORY_CARE_PROVIDER_SITE_OTHER): Payer: BC Managed Care – PPO | Admitting: Sports Medicine

## 2013-02-10 ENCOUNTER — Telehealth: Payer: Self-pay | Admitting: *Deleted

## 2013-02-10 VITALS — BP 143/86 | HR 70 | Wt 196.0 lb

## 2013-02-10 DIAGNOSIS — M7711 Lateral epicondylitis, right elbow: Secondary | ICD-10-CM | POA: Insufficient documentation

## 2013-02-10 DIAGNOSIS — M7710 Lateral epicondylitis, unspecified elbow: Secondary | ICD-10-CM

## 2013-02-10 MED ORDER — NAPROXEN 500 MG PO TABS: 500.0000 mg | ORAL_TABLET | Freq: Two times a day (BID) | ORAL | Status: DC

## 2013-02-10 NOTE — Telephone Encounter (Signed)
Pt notified & is coming in this afternoon.

## 2013-02-10 NOTE — Assessment & Plan Note (Signed)
Counterforce brace. Home exercises. Naproxen 500 twice a day. Return in 3 weeks, consider injection if no better.

## 2013-02-10 NOTE — Progress Notes (Signed)
  Subjective:    CC: Elbow pain  HPI: This pleasant 57 year old male with a history of right lateral epicondylitis comes back with a couple week history of pain that he localizes over the lateral aspect of his right elbow that resembles his prior episodes. Pain is localized, he's been wearing a counterforce brace that he hasn't home, and this is provided only minimal improvement. He's also been only using occasional anti-inflammatory. Pain is mild to moderate. Stable.  Past medical history, Surgical history, Family history not pertinant except as noted below, Social history, Allergies, and medications have been entered into the medical record, reviewed, and no changes needed.   Review of Systems: No fevers, chills, night sweats, weight loss, chest pain, or shortness of breath.   Objective:    General: Well Developed, well nourished, and in no acute distress.  Neuro: Alert and oriented x3, extra-ocular muscles intact, sensation grossly intact.  HEENT: Normocephalic, atraumatic, pupils equal round reactive to light, neck supple, no masses, no lymphadenopathy, thyroid nonpalpable.  Skin: Warm and dry, no rashes. Cardiac: Regular rate and rhythm, no murmurs rubs or gallops, no lower extremity edema.  Respiratory: Clear to auscultation bilaterally. Not using accessory muscles, speaking in full sentences. Right Elbow: Unremarkable to inspection. Range of motion full pronation, supination, flexion, extension. Strength is full to all of the above directions Stable to varus, valgus stress. Negative moving valgus stress test. Tender to palpation over the lateral upper condyle, reproduction of pain with resisted extension of the middle finger. Ulnar nerve does not sublux. Negative cubital tunnel Tinel's.  Impression and Recommendations:

## 2013-02-10 NOTE — Telephone Encounter (Signed)
Pt calls today & states that he has been having right elbow pain for about a week.  States the outside is sore.  Unaware of any injury to it.  States that he thinks he was told he had "tennis elbow" about 10 years ago.  Would you like him to come see you for this or do you have any other suggestions? He states that he had an elbow band & is going to try to find it.  He's a jade pt. Please advise.

## 2013-02-10 NOTE — Telephone Encounter (Signed)
Have him come as early as he can in the afternoon, we can double book, remind the front that Ortho/[facility]/sports issues can be double booked.

## 2013-02-15 ENCOUNTER — Other Ambulatory Visit: Payer: Self-pay | Admitting: Physician Assistant

## 2013-02-15 MED ORDER — MAGIC MOUTHWASH: 5.0000 mL | Freq: Four times a day (QID) | ORAL | Status: DC

## 2013-03-13 ENCOUNTER — Other Ambulatory Visit: Payer: Self-pay | Admitting: Family Medicine

## 2013-06-13 ENCOUNTER — Telehealth: Payer: Self-pay | Admitting: *Deleted

## 2013-06-13 DIAGNOSIS — C61 Malignant neoplasm of prostate: Secondary | ICD-10-CM

## 2013-06-13 NOTE — Telephone Encounter (Signed)
Pt requested to have PSA drawn due to him going to specialist tomorrow and to avoid 2 sticks.  Meyer Cory, LPN

## 2013-06-14 LAB — PSA, TOTAL AND FREE: PSA, Free: 0.44 ng/mL

## 2013-06-14 LAB — LIPID PANEL
Cholesterol: 168 mg/dL (ref 0–200)
LDL Cholesterol: 94 mg/dL (ref 0–99)
Triglycerides: 174 mg/dL — ABNORMAL HIGH (ref <150)
VLDL: 35 mg/dL (ref 0–40)

## 2013-06-14 LAB — COMPREHENSIVE METABOLIC PANEL
ALT: 23 U/L (ref 0–53)
Albumin: 4.4 g/dL (ref 3.5–5.2)
Alkaline Phosphatase: 61 U/L (ref 39–117)
CO2: 24 mEq/L (ref 19–32)
Glucose, Bld: 87 mg/dL (ref 70–99)
Potassium: 4.3 mEq/L (ref 3.5–5.3)
Sodium: 138 mEq/L (ref 135–145)
Total Bilirubin: 0.9 mg/dL (ref 0.3–1.2)
Total Protein: 6.9 g/dL (ref 6.0–8.3)

## 2013-06-30 ENCOUNTER — Other Ambulatory Visit: Payer: Self-pay | Admitting: *Deleted

## 2013-06-30 MED ORDER — VARDENAFIL HCL 20 MG PO TABS: 20.0000 mg | ORAL_TABLET | Freq: Every day | ORAL | Status: DC | PRN

## 2013-07-06 ENCOUNTER — Other Ambulatory Visit: Payer: BC Managed Care – PPO | Admitting: Lab

## 2013-07-06 ENCOUNTER — Ambulatory Visit: Payer: BC Managed Care – PPO | Admitting: Oncology

## 2013-07-26 ENCOUNTER — Ambulatory Visit: Payer: BC Managed Care – PPO | Admitting: Physician Assistant

## 2013-08-25 ENCOUNTER — Telehealth: Payer: Self-pay | Admitting: *Deleted

## 2013-08-25 NOTE — Telephone Encounter (Signed)
Patient calling to request  A yearly follow-up appt. Note to dr Clelia Croft, if  1st available okay?

## 2013-08-28 ENCOUNTER — Other Ambulatory Visit: Payer: Self-pay | Admitting: *Deleted

## 2013-08-28 ENCOUNTER — Telehealth: Payer: Self-pay | Admitting: Oncology

## 2013-08-28 NOTE — Telephone Encounter (Signed)
s/w wife re appt for 11/21.

## 2013-09-01 ENCOUNTER — Telehealth: Payer: Self-pay | Admitting: Medical Oncology

## 2013-09-01 ENCOUNTER — Ambulatory Visit (HOSPITAL_BASED_OUTPATIENT_CLINIC_OR_DEPARTMENT_OTHER): Payer: BC Managed Care – PPO | Admitting: Lab

## 2013-09-01 ENCOUNTER — Ambulatory Visit (HOSPITAL_BASED_OUTPATIENT_CLINIC_OR_DEPARTMENT_OTHER): Payer: BC Managed Care – PPO | Admitting: Oncology

## 2013-09-01 VITALS — BP 148/96 | HR 69 | Temp 97.9°F | Resp 18 | Ht 71.0 in | Wt 199.5 lb

## 2013-09-01 DIAGNOSIS — C8299 Follicular lymphoma, unspecified, extranodal and solid organ sites: Secondary | ICD-10-CM

## 2013-09-01 DIAGNOSIS — C61 Malignant neoplasm of prostate: Secondary | ICD-10-CM

## 2013-09-01 LAB — CBC WITH DIFFERENTIAL/PLATELET
EOS%: 4.8 % (ref 0.0–7.0)
Eosinophils Absolute: 0.4 10*3/uL (ref 0.0–0.5)
LYMPH%: 22.6 % (ref 14.0–49.0)
MCH: 31.2 pg (ref 27.2–33.4)
MCV: 88.4 fL (ref 79.3–98.0)
MONO%: 9 % (ref 0.0–14.0)
NEUT%: 62.8 % (ref 39.0–75.0)
RBC: 5.07 10*6/uL (ref 4.20–5.82)
RDW: 13.2 % (ref 11.0–14.6)

## 2013-09-01 LAB — COMPREHENSIVE METABOLIC PANEL (CC13)
AST: 19 U/L (ref 5–34)
Albumin: 4 g/dL (ref 3.5–5.0)
Alkaline Phosphatase: 67 U/L (ref 40–150)
Anion Gap: 9 mEq/L (ref 3–11)
BUN: 12.3 mg/dL (ref 7.0–26.0)
Glucose: 101 mg/dl (ref 70–140)
Potassium: 4.4 mEq/L (ref 3.5–5.1)
Sodium: 138 mEq/L (ref 136–145)
Total Bilirubin: 0.7 mg/dL (ref 0.20–1.20)

## 2013-09-01 NOTE — Telephone Encounter (Signed)
Message copied by Rexene Edison on Fri Sep 01, 2013  2:39 PM ------      Message from: Benjiman Core      Created: Fri Sep 01, 2013  1:54 PM       Please call the patient and let him know that all of his blood results are normal. ------

## 2013-09-01 NOTE — Telephone Encounter (Signed)
Per MD, informed patient that all his blood results are normal. Patient expressed thanks, no no further questions at this time.

## 2013-09-01 NOTE — Progress Notes (Signed)
Hematology and Oncology Follow Up Visit  Jack Grimes [id number] [birth year] 57 y.o. 09/01/2013 12:20 PM  CC: Jack Grimes, D.O. [facility] Lucrezia Starch. Earlene Plater, M.D.    Principle Diagnosis: A 56 year old gentleman with the following diagnoses. 1. 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  He has been in remission since 2006.  Treatment was done in [place], [place]. 2.     History of prostate cancer Gleason score 3 plus 3 equals 6.  PSA was around 1.3 diagnosed in 2003.  He has been on active surveillance.   Interim History:  Jack Grimes presents today for a followup visit.  He has continued to be asymptomatic from the above problems as mentioned.  He had not reported any recent history.  Had not reported any recent complaints.  He does not report any abdominal pain.  He did not report any major changes in his performance status or activity level.  He did not report any urination difficulties.  Overall performance status and activity level remains excellent. He following up with Dr. Laverle Patter for his prostate cancer standpoint. He has not reported any recent hospitalizations or illnesses.  Medications: I have reviewed the patient's current medications.  Current Outpatient Prescriptions  Medication Sig Dispense Refill  . amLODipine (NORVASC) 10 MG tablet TAKE 1 TABLET (10 MG TOTAL) BY MOUTH DAILY.  90 tablet  2  . aspirin 81 MG tablet Take 81 mg by mouth daily.        . diazepam (VALIUM) 2 MG tablet Take 1 tablet (2 mg total) by mouth at bedtime as needed.  30 tablet  1  . esomeprazole (NEXIUM) 40 MG capsule Take 1 capsule (40 mg total) by mouth daily before breakfast.  90 capsule  1  . metoprolol succinate (TOPROL XL) 50 MG 24 hr tablet Take 1 tablet (50 mg total) by mouth daily. Take with or immediately following a meal.  90 tablet  1  . NEXIUM 40 MG capsule TAKE 1 CAPSULE (40 MG TOTAL) BY MOUTH DAILY BEFORE  BREAKFAST.  90 capsule  1  . nystatin-triamcinolone ointment (MYCOLOG) Apply topically 2 (two) times daily as needed.  60 g  0  . sertraline (ZOLOFT) 50 MG tablet Take 1 tablet (50 mg total) by mouth daily.  90 tablet  1  . sertraline (ZOLOFT) 50 MG tablet TAKE 1 TABLET (50 MG TOTAL) BY MOUTH DAILY.  90 tablet  0  . fluticasone (FLONASE) 50 MCG/ACT nasal spray Place 2 sprays into the nose daily.  1 g  6  . vardenafil (LEVITRA) 20 MG tablet Take 1 tablet (20 mg total) by mouth daily as needed for erectile dysfunction.  5 tablet  6   No current facility-administered medications for this visit.    Allergies:  Allergies  Allergen Reactions  . Lisinopril-Hydrochlorothiazide     Calf swelling.  . Sulfonamide Derivatives     Past Medical History, Surgical history, Social history, and Family History were reviewed and updated.  Review of Systems:  Remaining ROS negative. Physical Exam: Blood pressure 148/96, pulse 69, temperature 97.9 F (36.6 C), resp. rate 18, height 5\' 11"  (1.803 m), weight 199 lb 8 oz (90.493 kg), SpO2 98.00%. ECOG: 0 General appearance: alert Head: Normocephalic, without obvious abnormality, atraumatic Neck: no adenopathy, no carotid bruit, no JVD, supple, symmetrical, trachea midline and thyroid not enlarged, symmetric, no tenderness/mass/nodules Lymph nodes: Cervical, supraclavicular, and axillary nodes normal. Heart:regular rate and rhythm,  S1, S2 normal, no murmur, click, rub or gallop Lung:chest clear, no wheezing, rales, normal symmetric air entry Abdomin: soft, non-tender, without masses or organomegaly EXT:no erythema, induration, or nodules   Impression and Plan: This is a 57 year old gentleman with the following issues. 1. History of follicular lymphoma, low grade, a stage 1E treated with external beam radiation.  Physical examination does not reveal any evidence to suggest recurrence. 2. History of prostate cancer, had a Gleason score of 3 plus 3 equals  6.  PSA is stable, have ranged between 1.3 and 1.5, he is asymptomatic.  He following up with Dr. Laverle Patter with active surveillance at this point.  3. Follow up: in one year.      [facility], MD 11/21/201412:20 PM

## 2013-09-21 ENCOUNTER — Ambulatory Visit: Payer: Self-pay | Admitting: Sports Medicine

## 2013-09-23 ENCOUNTER — Other Ambulatory Visit: Payer: Self-pay | Admitting: Family Medicine

## 2013-09-25 ENCOUNTER — Encounter: Payer: Self-pay | Admitting: Sports Medicine

## 2013-09-25 ENCOUNTER — Ambulatory Visit (INDEPENDENT_AMBULATORY_CARE_PROVIDER_SITE_OTHER): Payer: BC Managed Care – PPO | Admitting: Sports Medicine

## 2013-09-25 ENCOUNTER — Ambulatory Visit (INDEPENDENT_AMBULATORY_CARE_PROVIDER_SITE_OTHER): Payer: BC Managed Care – PPO

## 2013-09-25 VITALS — BP 139/89 | HR 71 | Wt 203.0 lb

## 2013-09-25 DIAGNOSIS — M25561 Pain in right knee: Secondary | ICD-10-CM

## 2013-09-25 DIAGNOSIS — M25569 Pain in unspecified knee: Secondary | ICD-10-CM

## 2013-09-25 MED ORDER — MELOXICAM 15 MG PO TABS: ORAL_TABLET | ORAL | Status: DC

## 2013-09-25 NOTE — Progress Notes (Signed)
  Subjective:    CC: Right knee pain  HPI: This is a very pleasant 57 year old male comes in with a several month history of pain he localizes beneath the patella. Pain is worse when bending the knee and going up and downstairs. He does get minimal swelling. Denies any clicking, locking. Has really not tried any oral medications yet. Pain is moderate, persistent.  Past medical history, Surgical history, Family history not pertinant except as noted below, Social history, Allergies, and medications have been entered into the medical record, reviewed, and no changes needed.   Review of Systems: No fevers, chills, night sweats, weight loss, chest pain, or shortness of breath.   Objective:    General: Well Developed, well nourished, and in no acute distress.  Neuro: Alert and oriented x3, extra-ocular muscles intact, sensation grossly intact.  HEENT: Normocephalic, atraumatic, pupils equal round reactive to light, neck supple, no masses, no lymphadenopathy, thyroid nonpalpable.  Skin: Warm and dry, no rashes. Cardiac: Regular rate and rhythm, no murmurs rubs or gallops, no lower extremity edema.  Respiratory: Clear to auscultation bilaterally. Not using accessory muscles, speaking in full sentences. Right Knee: Minimal swelling, no tenderness to palpation over the joint lines. ROM full in flexion and extension and lower leg rotation. Ligaments with solid consistent endpoints including ACL, PCL, LCL, MCL. Negative Mcmurray's, Apley's, and Thessalonian tests. Painful patellar compression. Patellar glide without crepitus. Patellar and quadriceps tendons unremarkable. Hamstring and quadriceps strength is normal.   X-rays were reviewed and are overall unremarkable.  Impression and Recommendations:

## 2013-09-25 NOTE — Assessment & Plan Note (Signed)
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 Patellofemoral exercises, x-rays, Mobic, continue knee brace that he already has. Return to see me in one month, consider injection if no better.

## 2013-10-18 ENCOUNTER — Ambulatory Visit: Payer: Self-pay | Admitting: Sports Medicine

## 2013-10-18 DIAGNOSIS — Z0289 Encounter for other administrative examinations: Secondary | ICD-10-CM

## 2013-10-24 ENCOUNTER — Other Ambulatory Visit: Payer: Self-pay | Admitting: Family Medicine

## 2013-11-20 ENCOUNTER — Other Ambulatory Visit: Payer: Self-pay | Admitting: *Deleted

## 2013-11-20 MED ORDER — VARDENAFIL HCL 20 MG PO TABS: 20.0000 mg | ORAL_TABLET | Freq: Every day | ORAL | Status: DC | PRN

## 2014-01-17 ENCOUNTER — Encounter: Payer: Self-pay | Admitting: Physician Assistant

## 2014-01-17 ENCOUNTER — Ambulatory Visit (INDEPENDENT_AMBULATORY_CARE_PROVIDER_SITE_OTHER): Payer: BC Managed Care – PPO | Admitting: Physician Assistant

## 2014-01-17 VITALS — BP 136/80 | HR 80 | Ht 71.0 in | Wt 202.0 lb

## 2014-01-17 DIAGNOSIS — W57XXXA Bitten or stung by nonvenomous insect and other nonvenomous arthropods, initial encounter: Secondary | ICD-10-CM

## 2014-01-17 DIAGNOSIS — M79675 Pain in left toe(s): Secondary | ICD-10-CM

## 2014-01-17 DIAGNOSIS — M79609 Pain in unspecified limb: Secondary | ICD-10-CM

## 2014-01-17 DIAGNOSIS — J302 Other seasonal allergic rhinitis: Secondary | ICD-10-CM

## 2014-01-17 DIAGNOSIS — J309 Allergic rhinitis, unspecified: Secondary | ICD-10-CM

## 2014-01-17 MED ORDER — FLUTICASONE PROPIONATE 50 MCG/ACT NA SUSP: 2.0000 | Freq: Every day | NASAL | Status: DC

## 2014-01-17 MED ORDER — TRIAMCINOLONE ACETONIDE 0.5 % EX CREA: 1.0000 "application " | TOPICAL_CREAM | Freq: Two times a day (BID) | CUTANEOUS | Status: DC

## 2014-01-17 NOTE — Patient Instructions (Addendum)
Switch to Zyrtec daily. Continue to use nasal spray daily.   (beta glucan-taking once a day to help with allergies)   triamincinolone cream for spots on belly.

## 2014-01-17 NOTE — Progress Notes (Signed)
   Subjective:    Patient ID: Jack Grimes, male    DOB: Sep 21, 1956, 58 y.o.   MRN: [id number]  HPI Patient is a 58 year old male who presents to the clinic with allergy symptoms, bites on his stomach, but the fifth metatarsal toe pain.  His allergy symptoms have increased over the last month. He sneezed 11 times on the way to this doctor's appointment. His hospital watery and itchy. Has been taking Claritin daily for the last 6 weeks. He has not used any nasal sprays. He denies any fever, chills, nausea, vomiting, sinus pressure, ear pain or sore throat. He does have some occasional nasal congestion.  Patient also noticed about 3 days ago spot on his abdomen. After further inspection he noticed a few spots down in his suprapubic area. They are red. They do not itch or burn. He has not had anything on him to make better. They seem to be resolving on their own. He has been sleeping in hotel rooms more recently. He is concerned about the bed bugs. He is also concerned about scabies and or herpes.  Patient stopped his toe about 2 weeks ago. The injury occurred on his left foot and fifth metatarsal. A bruise and small for a few days. It is still tender to touch and he wonders if there's anything to do to help with pain.   Review of Systems     Objective:   Physical Exam  Constitutional: He is oriented to person, place, and time. He appears well-developed and well-nourished.  HENT:  Head: Normocephalic and atraumatic.  Right Ear: External ear normal.  Left Ear: External ear normal.  Nose: Nose normal.  Mouth/Throat: Oropharynx is clear and moist.  Eyes:  Bilateral injected conjunctiva with watery discharge.  Neck: Normal range of motion. Neck supple.  Cardiovascular: Normal rate, regular rhythm and normal heart sounds.   Pulmonary/Chest: Effort normal and breath sounds normal. He has no wheezes.  Abdominal:    Musculoskeletal:  Pain with palpation over distal joint of fifth metatarsal of  left foot. No bruising or swelling noted. No known deformity.  Lymphadenopathy:    He has no cervical adenopathy.  Neurological: He is alert and oriented to person, place, and time.  Skin: Skin is dry.  Psychiatric: He has a normal mood and affect. His behavior is normal.          Assessment & Plan:  Seasonal allergies-I suggested switching Claritin to Zyrtec. Potentially add a couple days of decongestant such as Sudafed for 4-5 days. Wrote patient a prescription for Flonase to use 2 sprays each nostril daily. Consider beta gluten as a more natural agent for allergies. Nasal saline rinses can also help with allergies during this time.   Bug bites-reassured patient that since not itching and did not think was scabies. I also do not see vesicular lesions that would be indicative of herpes. He still like uncomplicated bug bite. Adjacent triamcinolone cream to place on bites twice a day until resolved. Call if not improving or if worsening.  Left foot fifth metatarsal injury-discuss with patient that is not what to do for broken toes especially after 2 weeks. Showed patient how to buddy tape the fifth metatarsal to the fourth metatarsal for support. Continue to use ibuprofen or Tylenol for pain. Followup with Dr. Darene Lamer. If to bother him. He could consider orthotics to get a more support.continuing

## 2014-02-07 ENCOUNTER — Telehealth: Payer: Self-pay | Admitting: *Deleted

## 2014-02-07 ENCOUNTER — Other Ambulatory Visit: Payer: Self-pay | Admitting: Physician Assistant

## 2014-02-07 MED ORDER — CLOBETASOL PROPIONATE 0.05 % EX CREA: 1.0000 "application " | TOPICAL_CREAM | Freq: Two times a day (BID) | CUTANEOUS | Status: DC

## 2014-02-07 NOTE — Telephone Encounter (Signed)
Will send over stronger steroid cream. If not improving can send to dermatology or come back to have biopsied.

## 2014-02-07 NOTE — Telephone Encounter (Signed)
Pt calls stating that he's using the triamcinolone cream but the places are still red.  He is wondering if you need to see him again or if there is an oral med he could try. Please advise.

## 2014-02-07 NOTE — Telephone Encounter (Signed)
Left detailed message notifying pt.

## 2014-04-07 ENCOUNTER — Other Ambulatory Visit: Payer: Self-pay | Admitting: Family Medicine

## 2014-04-13 ENCOUNTER — Other Ambulatory Visit: Payer: Self-pay | Admitting: Family Medicine

## 2014-04-16 ENCOUNTER — Other Ambulatory Visit: Payer: Self-pay | Admitting: Family Medicine

## 2014-05-09 ENCOUNTER — Encounter: Payer: Self-pay | Admitting: Physician Assistant

## 2014-05-09 ENCOUNTER — Ambulatory Visit (INDEPENDENT_AMBULATORY_CARE_PROVIDER_SITE_OTHER): Payer: BC Managed Care – PPO | Admitting: Physician Assistant

## 2014-05-09 VITALS — BP 141/93 | HR 71 | Ht 71.0 in | Wt 196.4 lb

## 2014-05-09 DIAGNOSIS — Z79899 Other long term (current) drug therapy: Secondary | ICD-10-CM

## 2014-05-09 DIAGNOSIS — I10 Essential (primary) hypertension: Secondary | ICD-10-CM

## 2014-05-09 DIAGNOSIS — R229 Localized swelling, mass and lump, unspecified: Secondary | ICD-10-CM

## 2014-05-09 DIAGNOSIS — F528 Other sexual dysfunction not due to a substance or known physiological condition: Secondary | ICD-10-CM

## 2014-05-09 DIAGNOSIS — F411 Generalized anxiety disorder: Secondary | ICD-10-CM

## 2014-05-09 DIAGNOSIS — F5221 Male erectile disorder: Secondary | ICD-10-CM

## 2014-05-09 DIAGNOSIS — K219 Gastro-esophageal reflux disease without esophagitis: Secondary | ICD-10-CM

## 2014-05-09 LAB — COMPLETE METABOLIC PANEL WITH GFR
ALBUMIN: 4.3 g/dL (ref 3.5–5.2)
ALK PHOS: 63 U/L (ref 39–117)
ALT: 35 U/L (ref 0–53)
AST: 20 U/L (ref 0–37)
BUN: 13 mg/dL (ref 6–23)
CO2: 25 mEq/L (ref 19–32)
Calcium: 9.4 mg/dL (ref 8.4–10.5)
Chloride: 104 mEq/L (ref 96–112)
Creat: 0.81 mg/dL (ref 0.50–1.35)
GFR, Est African American: >89 mL/min
GFR, Est Non African American: >89 mL/min
Glucose, Bld: 90 mg/dL (ref 70–99)
Potassium: 4.3 mEq/L (ref 3.5–5.3)
Sodium: 139 mEq/L (ref 135–145)
Total Bilirubin: 0.8 mg/dL (ref 0.2–1.2)
Total Protein: 6.9 g/dL (ref 6.0–8.3)

## 2014-05-09 MED ORDER — SERTRALINE HCL 50 MG PO TABS: ORAL_TABLET | ORAL | Status: DC

## 2014-05-09 MED ORDER — AMLODIPINE BESYLATE 10 MG PO TABS: ORAL_TABLET | ORAL | Status: DC

## 2014-05-09 MED ORDER — VARDENAFIL HCL 20 MG PO TABS: 20.0000 mg | ORAL_TABLET | Freq: Every day | ORAL | Status: DC | PRN

## 2014-05-09 MED ORDER — ESOMEPRAZOLE MAGNESIUM 40 MG PO CPDR: DELAYED_RELEASE_CAPSULE | ORAL | Status: DC

## 2014-05-11 DIAGNOSIS — F5221 Male erectile disorder: Secondary | ICD-10-CM | POA: Insufficient documentation

## 2014-05-11 NOTE — Progress Notes (Signed)
   Subjective:    Patient ID: Jack Grimes, male    DOB: 02/24/1956, 58 y.o.   MRN: [id number]  HPI Pt is a 58 yo male who presents to the clinic for medication refill.   ED- doing great on levitra needs refill.   GAD- doing great on zoloft needs refill. No problems or concerns.   GERD- controlled on nexium.   HTN- controlled on norvasc. Denies any CP, palpitations, vision changes, headache or vision changes. No medication this am.   Pt does have a bump on the left side of his neck that he keeps picky at. Denies any pain but wants it checked out. Just noticed it in the last month.     Review of Systems  All other systems reviewed and are negative.      Objective:   Physical Exam  Constitutional: He is oriented to person, place, and time. He appears well-developed and well-nourished.  HENT:  Head: Normocephalic and atraumatic.  Neck:    Cardiovascular: Normal rate, regular rhythm and normal heart sounds.   Pulmonary/Chest: Effort normal and breath sounds normal.  Neurological: He is alert and oriented to person, place, and time.  Psychiatric: He has a normal mood and affect. His behavior is normal.          Assessment & Plan:  ED- refilled levitra.   GERD- refilled nexium  GAD- GAD-7 was 6. Refilled zoloft.   HTN- refilled norvasc. Pt did not have medication this am.   Skin papule- dermatofibroma or non-pigmented nevus. Keep hands off. Reassured pt. Discussed can remove if continues to bother him. If growing on changing in size or color follow up.

## 2014-05-14 ENCOUNTER — Telehealth: Payer: Self-pay

## 2014-05-14 MED ORDER — PANTOPRAZOLE SODIUM 40 MG PO TBEC: 40.0000 mg | DELAYED_RELEASE_TABLET | Freq: Every day | ORAL | Status: DC

## 2014-05-14 NOTE — Telephone Encounter (Signed)
[place] for protonix. Explain that essentially the same medication just different formula. protonix 40mg  one po qd #90 1 refill. Please delete nexium off med list.

## 2014-05-14 NOTE — Telephone Encounter (Signed)
Jack Grimes called and stated that there is a problem with his Nexium, that the insurance is not letting him fill it. I called the pharmacy and the pharmacist stated that esomeprazole is no longer covered by Jack Grimes insurance but they did state that the brand Nexium was covered but the patient would have to pay $130.99 out of pocket. I checked the formulary for [employer] and Zegerid or Protonix-delayed release are covered./[name],CMA

## 2014-05-14 NOTE — Telephone Encounter (Signed)
[name] informed, Nexium taken off his med list and protonix added and sent in to [facility]./Jack Grimes,CMA

## 2014-08-31 ENCOUNTER — Telehealth: Payer: Self-pay | Admitting: Oncology

## 2014-08-31 ENCOUNTER — Other Ambulatory Visit (HOSPITAL_BASED_OUTPATIENT_CLINIC_OR_DEPARTMENT_OTHER): Payer: BC Managed Care – PPO

## 2014-08-31 ENCOUNTER — Ambulatory Visit: Payer: Self-pay | Admitting: Lab

## 2014-08-31 ENCOUNTER — Ambulatory Visit (HOSPITAL_BASED_OUTPATIENT_CLINIC_OR_DEPARTMENT_OTHER): Payer: BC Managed Care – PPO | Admitting: Oncology

## 2014-08-31 VITALS — BP 136/82 | HR 67 | Temp 98.1°F | Resp 19 | Ht 71.0 in | Wt 199.8 lb

## 2014-08-31 DIAGNOSIS — C61 Malignant neoplasm of prostate: Secondary | ICD-10-CM

## 2014-08-31 DIAGNOSIS — Z8572 Personal history of non-Hodgkin lymphomas: Secondary | ICD-10-CM

## 2014-08-31 LAB — CBC WITH DIFFERENTIAL/PLATELET
BASO%: 1.1 % (ref 0.0–2.0)
BASOS ABS: 0.1 10*3/uL (ref 0.0–0.1)
EOS%: 5.1 % (ref 0.0–7.0)
Eosinophils Absolute: 0.3 10*3/uL (ref 0.0–0.5)
HCT: 44.4 % (ref 38.4–49.9)
HGB: 15.5 g/dL (ref 13.0–17.1)
LYMPH%: 28.8 % (ref 14.0–49.0)
MCH: 31.1 pg (ref 27.2–33.4)
MCHC: 34.9 g/dL (ref 32.0–36.0)
MCV: 89.2 fL (ref 79.3–98.0)
MONO#: 0.5 10*3/uL (ref 0.1–0.9)
MONO%: 8.5 % (ref 0.0–14.0)
NEUT#: 3.5 10*3/uL (ref 1.5–6.5)
NEUT%: 56.5 % (ref 39.0–75.0)
Platelets: 213 10*3/uL (ref 140–400)
RBC: 4.98 10*6/uL (ref 4.20–5.82)
RDW: 13.2 % (ref 11.0–14.6)
WBC: 6.3 10*3/uL (ref 4.0–10.3)
lymph#: 1.8 10*3/uL (ref 0.9–3.3)

## 2014-08-31 LAB — COMPREHENSIVE METABOLIC PANEL (CC13)
ALBUMIN: 4 g/dL (ref 3.5–5.0)
ALK PHOS: 70 U/L (ref 40–150)
ALT: 32 U/L (ref 0–55)
AST: 15 U/L (ref 5–34)
Anion Gap: 10 mEq/L (ref 3–11)
BUN: 11.2 mg/dL (ref 7.0–26.0)
CO2: 22 mEq/L (ref 22–29)
Calcium: 9.4 mg/dL (ref 8.4–10.4)
Chloride: 108 mEq/L (ref 98–109)
Creatinine: 0.9 mg/dL (ref 0.7–1.3)
Glucose: 107 mg/dl (ref 70–140)
POTASSIUM: 3.8 meq/L (ref 3.5–5.1)
Sodium: 141 mEq/L (ref 136–145)
Total Bilirubin: 0.63 mg/dL (ref 0.20–1.20)
Total Protein: 6.8 g/dL (ref 6.4–8.3)

## 2014-08-31 NOTE — Telephone Encounter (Signed)
Gave avs & cal for Nov 2016. °

## 2014-08-31 NOTE — Progress Notes (Signed)
Hematology and Oncology Follow Up Visit  Jack Grimes [id number] 12-Dec-1955 58 y.o. 08/31/2014 9:18 AM  CC: [name], D.O. [facility] [place] Rosana Hoes, M.D.    Principle Diagnosis: A 58 year old gentleman with the following diagnoses. 1. 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  He has been in remission since 2006.  Treatment was done in [place], [place]. 2.     History of prostate cancer Gleason score 3 plus 3 equals 6.  PSA was around 1.3 diagnosed in 2003.  He has been on active surveillance.   Interim History:  [name] presents today for a followup visit. Since his last visit, he continues to do well without any complaints. He continues to work full-time without any hindrance or decline. He does not report any headaches or blurry vision or syncope. He does not report any chest pain, shortness of breath or cough. He does not report any abdominal pain.  He did not report any major changes in his performance status or activity level.  He did not report any urination difficulties.  Overall performance status and activity level remains excellent. He following up with Dr. Alinda Money for his prostate cancer standpoint. He has not reported any recent hospitalizations or illnesses. He does not report any constitutional symptoms, lymphadenopathy or petechiae. Rest of his review of systems unremarkable.  Medications: I have reviewed the patient's current medications.  Current Outpatient Prescriptions  Medication Sig Dispense Refill  . amLODipine (NORVASC) 10 MG tablet TAKE 1 TABLET (10 MG TOTAL) BY MOUTH DAILY. 90 tablet 1  . aspirin 81 MG tablet Take 81 mg by mouth daily.      . clobetasol cream (TEMOVATE) 2.24 % Apply 1 application topically 2 (two) times daily. 60 g 2  . diazepam (VALIUM) 2 MG tablet Take 1 tablet (2 mg total) by mouth at bedtime as needed. 30 tablet 1  . fluticasone (FLONASE) 50 MCG/ACT  nasal spray Place 2 sprays into both nostrils daily. 16 g 6  . nystatin-triamcinolone ointment (MYCOLOG) Apply topically 2 (two) times daily as needed. 60 g 0  . pantoprazole (PROTONIX) 40 MG tablet Take 1 tablet (40 mg total) by mouth daily. 90 tablet 1  . sertraline (ZOLOFT) 50 MG tablet TAKE 1 TABLET BY MOUTH DAILY 90 tablet 1  . vardenafil (LEVITRA) 20 MG tablet Take 1 tablet (20 mg total) by mouth daily as needed for erectile dysfunction. 5 tablet 6   No current facility-administered medications for this visit.    Allergies:  Allergies  Allergen Reactions  . Lisinopril-Hydrochlorothiazide     Calf swelling.  . Sulfonamide Derivatives     Past Medical History, Surgical history, Social history, and Family History were reviewed and updated.   Physical Exam: Blood pressure 136/82, pulse 67, temperature 98.1 F (36.7 C), temperature source Oral, resp. rate 19, height 5\' 11"  (1.803 m), weight 199 lb 12.8 oz (90.629 kg), SpO2 100 %. ECOG: 0 General appearance: alert Head: Normocephalic, without obvious abnormality, atraumatic Neck: no adenopathy, no carotid bruit, no JVD, supple, symmetrical, trachea midline and thyroid not enlarged, symmetric, no tenderness/mass/nodules Lymph nodes: Cervical, supraclavicular, and axillary nodes normal. Heart:regular rate and rhythm, S1, S2 normal, no murmur, click, rub or gallop Lung:chest clear, no wheezing, rales, normal symmetric air entry Abdomin: soft, non-tender, without masses or organomegaly EXT:no erythema, induration, or nodules  CBC    Component Value Date/Time   WBC 6.3 08/31/2014 0837   WBC  5.6 09/18/2009 1518   RBC 4.98 08/31/2014 0837   RBC 5.21 09/18/2009 1518   HGB 15.5 08/31/2014 0837   HGB 16.1 09/18/2009 1518   HCT 44.4 08/31/2014 0837   HCT 46.0 09/18/2009 1518   PLT 213 08/31/2014 0837   PLT 212 09/18/2009 1518   MCV 89.2 08/31/2014 0837   MCV 88.3 09/18/2009 1518   MCH 31.1 08/31/2014 0837   MCH 31.5 10/31/2010  1055   MCHC 34.9 08/31/2014 0837   MCHC 35.0 09/18/2009 1518   RDW 13.2 08/31/2014 0837   RDW 12.8 09/18/2009 1518   LYMPHSABS 1.8 08/31/2014 0837   LYMPHSABS 1.7 09/18/2009 1518   MONOABS 0.5 08/31/2014 0837   MONOABS 0.5 09/18/2009 1518   EOSABS 0.3 08/31/2014 0837   EOSABS 0.3 09/18/2009 1518   BASOSABS 0.1 08/31/2014 0837   BASOSABS 0.0 09/18/2009 1518     Impression and Plan: This is a 58 year old gentleman with the following issues. 1. History of follicular lymphoma, low grade, a stage 1E treated with external beam radiation.  Physical examination and laboratory testing review today does not reveal any evidence to suggest recurrence. 2. History of prostate cancer, had a Gleason score of 3 plus 3 equals 6.  He following up with Dr. Alinda Money with active surveillance at this point. His PSA have ranged between 2 and 2.6 without any major changes. 3. Follow up: in one year.      JIRCVE,LFYBO, MD 11/20/20159:18 AM

## 2014-08-31 NOTE — Addendum Note (Signed)
Addended by: Randolm Idol on: 08/31/2014 10:47 AM   Modules accepted: Medications

## 2014-09-27 ENCOUNTER — Other Ambulatory Visit: Payer: Self-pay | Admitting: *Deleted

## 2014-09-27 MED ORDER — VARDENAFIL HCL 20 MG PO TABS: 20.0000 mg | ORAL_TABLET | Freq: Every day | ORAL | Status: DC | PRN

## 2014-09-27 MED ORDER — DIAZEPAM 2 MG PO TABS: 2.0000 mg | ORAL_TABLET | Freq: Every evening | ORAL | Status: DC | PRN

## 2014-09-27 NOTE — Progress Notes (Signed)
Patient called for refills.

## 2014-10-31 ENCOUNTER — Encounter: Payer: Self-pay | Admitting: Physician Assistant

## 2014-10-31 ENCOUNTER — Ambulatory Visit (INDEPENDENT_AMBULATORY_CARE_PROVIDER_SITE_OTHER): Payer: BLUE CROSS/BLUE SHIELD | Admitting: Physician Assistant

## 2014-10-31 VITALS — BP 131/78 | HR 75 | Ht 71.0 in | Wt 196.0 lb

## 2014-10-31 DIAGNOSIS — K219 Gastro-esophageal reflux disease without esophagitis: Secondary | ICD-10-CM | POA: Diagnosis not present

## 2014-10-31 DIAGNOSIS — M2550 Pain in unspecified joint: Secondary | ICD-10-CM

## 2014-10-31 DIAGNOSIS — F411 Generalized anxiety disorder: Secondary | ICD-10-CM

## 2014-10-31 DIAGNOSIS — I10 Essential (primary) hypertension: Secondary | ICD-10-CM

## 2014-10-31 DIAGNOSIS — R682 Dry mouth, unspecified: Secondary | ICD-10-CM

## 2014-10-31 MED ORDER — AMLODIPINE BESYLATE 10 MG PO TABS: ORAL_TABLET | ORAL | Status: DC

## 2014-10-31 MED ORDER — ESOMEPRAZOLE MAGNESIUM 40 MG PO CPDR: 40.0000 mg | DELAYED_RELEASE_CAPSULE | Freq: Every day | ORAL | Status: DC

## 2014-10-31 MED ORDER — SERTRALINE HCL 50 MG PO TABS: ORAL_TABLET | ORAL | Status: DC

## 2014-10-31 MED ORDER — DIAZEPAM 2 MG PO TABS: 2.0000 mg | ORAL_TABLET | Freq: Every evening | ORAL | Status: DC | PRN

## 2014-10-31 NOTE — Patient Instructions (Addendum)
Tumeric Glucosamine chondrotin Fish oil  Biotene mouthwash

## 2014-10-31 NOTE — Progress Notes (Signed)
   Subjective:    Patient ID: Jack Grimes, male    DOB: July 31, 1956, 59 y.o.   MRN: [id number]  HPI  Patient presents to the clinic for 6 month follow-up on medications.  Anxiety-patient is doing well. He still feels some anxiety when he is around his family. He feels like they have high expectations of him. But he does feel that Zoloft is significantly helping.  GERD-doing well controlled needs refill.  He is having some dry mouth mostly in the morning. He has not tried anything to make better.  He is also having some stiffness and pain in bilateral knees, bilateral hips and lower back. Worse some days than others. No radiating pain down her thighs or legs. No known trauma or injury. Not taking anything orally or topically for this condition.  Review of Systems  All other systems reviewed and are negative.      Objective:   Physical Exam  Constitutional: He is oriented to person, place, and time. He appears well-developed and well-nourished.  HENT:  Head: Normocephalic and atraumatic.  Cardiovascular: Normal rate, regular rhythm and normal heart sounds.   Pulmonary/Chest: Effort normal and breath sounds normal. He has no wheezes.  Neurological: He is alert and oriented to person, place, and time.  Skin: Skin is dry.  Psychiatric: He has a normal mood and affect. His behavior is normal.          Assessment & Plan:  GAD- GAD-7 was 4. Controlled per pt. Refilled for 8 months until follow up.   GERD- refilled nexium. Doing well and controlled.   Multiple joint pain- suspect there some osteoarthritis presenting itself. He declines x-rays or further evaluation today. He does have a history of worsening acid reflux and ulcer when he uses anti-inflammatories for long periods of time. He can certainly consider using as needed ibuprofen for bad days. He should certainly look into other options for inflammation. Told him to consider tumeric, glucosamine chondroitin, fish oil. If not  improving or worsening please follow up. Stay active and keep moving. Consider low impact exercising.    Dry mouth- likely some due to medications as well as some due to dry air while sleeping and breathing through mouth. humidfer at bedtime and biotene mouthwash may help. Follow up as needed.   Discussed need for CPE in next 6-8 months.

## 2014-11-06 ENCOUNTER — Other Ambulatory Visit: Payer: Self-pay | Admitting: Physician Assistant

## 2014-11-12 ENCOUNTER — Ambulatory Visit: Payer: BC Managed Care – PPO | Admitting: Physician Assistant

## 2014-12-14 ENCOUNTER — Encounter: Payer: Self-pay | Admitting: Physician Assistant

## 2014-12-14 ENCOUNTER — Ambulatory Visit (INDEPENDENT_AMBULATORY_CARE_PROVIDER_SITE_OTHER): Payer: BLUE CROSS/BLUE SHIELD | Admitting: Physician Assistant

## 2014-12-14 VITALS — BP 128/75 | HR 77 | Ht 71.0 in | Wt 196.0 lb

## 2014-12-14 DIAGNOSIS — R21 Rash and other nonspecific skin eruption: Secondary | ICD-10-CM

## 2014-12-14 DIAGNOSIS — L259 Unspecified contact dermatitis, unspecified cause: Secondary | ICD-10-CM | POA: Diagnosis not present

## 2014-12-14 MED ORDER — PERMETHRIN 5 % EX CREA: 1.0000 "application " | TOPICAL_CREAM | Freq: Once | CUTANEOUS | Status: DC

## 2014-12-14 MED ORDER — METHYLPREDNISOLONE SODIUM SUCC 125 MG IJ SOLR
125.0000 mg | Freq: Once | INTRAMUSCULAR | Status: AC
  Administered 2014-12-14: 125 mg via INTRAMUSCULAR

## 2014-12-14 NOTE — Progress Notes (Signed)
   Subjective:    Patient ID: Jack Grimes, male    DOB: 11/23/1955, 59 y.o.   MRN: [id number]  HPI  Pt presents to the clinic with rash on his left lower anterior leg. Started about 1 week ago after being in woods. Very itchy no pain. Treated with a few applications of temovate which did help. Rash no other places. Denies fever, chills, n/v/d. Patient does admit that rash seems to have changed since initial presentation of more vesicles and oozing.    Review of Systems  All other systems reviewed and are negative.      Objective:   Physical Exam  Constitutional: He is oriented to person, place, and time. He appears well-developed and well-nourished.  HENT:  Head: Normocephalic and atraumatic.  Cardiovascular: Normal rate, regular rhythm and normal heart sounds.   Pulmonary/Chest: Effort normal and breath sounds normal.  Neurological: He is alert and oriented to person, place, and time.  Skin:     Psychiatric: He has a normal mood and affect. His behavior is normal.          Assessment & Plan:  Contact dermatitis/rash- patient has treated a few times with Temovate. He originally described the rash is more vesicular. I do think it is some type of contact dermatitis or perhaps from poison ivy. I would like for patient to try shot of Solu-Medrol 125 mg IM in office today. Continue Temovate twice a day for the next 3-4 days. Patient is very concerned this is scabies. I reassured him that this is not the same distribution scabies and is not presenting the same. I did give him Elimite to use after one week of above therapy.

## 2014-12-14 NOTE — Patient Instructions (Addendum)
Temovate twice a day for next 3-4 days.   If not improving has elmite cream to use.   Contact Dermatitis Contact dermatitis is a reaction to certain substances that touch the skin. Contact dermatitis can be either irritant contact dermatitis or allergic contact dermatitis. Irritant contact dermatitis does not require previous exposure to the substance for a reaction to occur.Allergic contact dermatitis only occurs if you have been exposed to the substance before. Upon a repeat exposure, your body reacts to the substance.  CAUSES  Many substances can cause contact dermatitis. Irritant dermatitis is most commonly caused by repeated exposure to mildly irritating substances, such as:  Makeup.  Soaps.  Detergents.  Bleaches.  Acids.  Metal salts, such as nickel. Allergic contact dermatitis is most commonly caused by exposure to:  Poisonous plants.  Chemicals (deodorants, shampoos).  Jewelry.  Latex.  Neomycin in triple antibiotic cream.  Preservatives in products, including clothing. SYMPTOMS  The area of skin that is exposed may develop:  Dryness or flaking.  Redness.  Cracks.  Itching.  Pain or a burning sensation.  Blisters. With allergic contact dermatitis, there may also be swelling in areas such as the eyelids, mouth, or genitals.  DIAGNOSIS  Your caregiver can usually tell what the problem is by doing a physical exam. In cases where the cause is uncertain and an allergic contact dermatitis is suspected, a patch skin test may be performed to help determine the cause of your dermatitis. TREATMENT Treatment includes protecting the skin from further contact with the irritating substance by avoiding that substance if possible. Barrier creams, powders, and gloves may be helpful. Your caregiver may also recommend:  Steroid creams or ointments applied 2 times daily. For best results, soak the rash area in cool water for 20 minutes. Then apply the medicine. Cover the  area with a plastic wrap. You can store the steroid cream in the refrigerator for a "chilly" effect on your rash. That may decrease itching. Oral steroid medicines may be needed in more severe cases.  Antibiotics or antibacterial ointments if a skin infection is present.  Antihistamine lotion or an antihistamine taken by mouth to ease itching.  Lubricants to keep moisture in your skin.  Burow's solution to reduce redness and soreness or to dry a weeping rash. Mix one packet or tablet of solution in 2 cups cool water. Dip a clean washcloth in the mixture, wring it out a bit, and put it on the affected area. Leave the cloth in place for 30 minutes. Do this as often as possible throughout the day.  Taking several cornstarch or baking soda baths daily if the area is too large to cover with a washcloth. Harsh chemicals, such as alkalis or acids, can cause skin damage that is like a burn. You should flush your skin for 15 to 20 minutes with cold water after such an exposure. You should also seek immediate medical care after exposure. Bandages (dressings), antibiotics, and pain medicine may be needed for severely irritated skin.  HOME CARE INSTRUCTIONS  Avoid the substance that caused your reaction.  Keep the area of skin that is affected away from hot water, soap, sunlight, chemicals, acidic substances, or anything else that would irritate your skin.  Do not scratch the rash. Scratching may cause the rash to become infected.  You may take cool baths to help stop the itching.  Only take over-the-counter or prescription medicines as directed by your caregiver.  See your caregiver for follow-up care as directed to  make sure your skin is healing properly. SEEK MEDICAL CARE IF:   Your condition is not better after 3 days of treatment.  You seem to be getting worse.  You see signs of infection such as swelling, tenderness, redness, soreness, or warmth in the affected area.  You have any problems  related to your medicines. Document Released: 09/25/2000 Document Revised: 12/21/2011 Document Reviewed: 03/03/2011 [facility] Patient Information 2015 [place], [place]. This information is not intended to replace advice given to you by your health care provider. Make sure you discuss any questions you have with your health care provider.

## 2015-04-25 ENCOUNTER — Ambulatory Visit (INDEPENDENT_AMBULATORY_CARE_PROVIDER_SITE_OTHER): Payer: BLUE CROSS/BLUE SHIELD | Admitting: Sports Medicine

## 2015-04-25 ENCOUNTER — Encounter: Payer: Self-pay | Admitting: Sports Medicine

## 2015-04-25 VITALS — BP 157/92 | HR 74 | Ht 71.0 in | Wt 193.0 lb

## 2015-04-25 DIAGNOSIS — I10 Essential (primary) hypertension: Secondary | ICD-10-CM

## 2015-04-25 DIAGNOSIS — H1131 Conjunctival hemorrhage, right eye: Secondary | ICD-10-CM | POA: Diagnosis not present

## 2015-04-25 DIAGNOSIS — G5602 Carpal tunnel syndrome, left upper limb: Secondary | ICD-10-CM | POA: Diagnosis not present

## 2015-04-25 NOTE — Patient Instructions (Signed)
Subconjunctival Hemorrhage °A subconjunctival hemorrhage is a bright red patch covering a portion of the white of the eye. The white part of the eye is called the sclera, and it is covered by a thin membrane called the conjunctiva. This membrane is clear, except for tiny blood vessels that you can see with the naked eye. When your eye is irritated or inflamed and becomes red, it is because the vessels in the conjunctiva are swollen. °Sometimes, a blood vessel in the conjunctiva can break and bleed. When this occurs, the blood builds up between the conjunctiva and the sclera, and spreads out to create a red area. The red spot may be very small at first. It may then spread to cover a larger part of the surface of the eye, or even all of the visible white part of the eye. °In almost all cases, the blood will go away and the eye will become white again. Before completely dissolving, however, the red area may spread. It may also become brownish-yellow in color before going away. If a lot of blood collects under the conjunctiva, it may look like a bulge on the surface of the eye. This looks scary, but it will also eventually flatten out and go away. Subconjunctival hemorrhages do not cause pain, but if swollen, may cause a feeling of irritation. There is no effect on vision.  °CAUSES  °· The most common cause is mild trauma (rubbing the eye, irritation). °· Subconjunctival hemorrhages can happen because of coughing or straining (lifting heavy objects), vomiting, or sneezing. °· In some cases, your doctor may want to check your blood pressure. High blood pressure can also cause a subconjunctival hemorrhage. °· Severe trauma or blunt injuries. °· Diseases that affect blood clotting (hemophilia, leukemia). °· Abnormalities of blood vessels behind the eye (carotid cavernous sinus fistula). °· Tumors behind the eye. °· Certain drugs (aspirin, Coumadin, heparin). °· Recent eye surgery. °HOME CARE INSTRUCTIONS  °· Do not worry  about the appearance of your eye. You may continue your usual activities. °· Often, follow-up is not necessary. °SEEK MEDICAL CARE IF:  °· Your eye becomes painful. °· The bleeding does not disappear within 3 weeks. °· Bleeding occurs elsewhere, for example, under the skin, in the mouth, or in the other eye. °· You have recurring subconjunctival hemorrhages. °SEEK IMMEDIATE MEDICAL CARE IF:  °· Your vision changes or you have difficulty seeing. °· You develop a severe headache, persistent vomiting, confusion, or abnormal drowsiness (lethargy). °· Your eye seems to bulge or protrude from the eye socket. °· You notice the sudden appearance of bruises or have spontaneous bleeding elsewhere on your body. °Document Released: 09/28/2005 Document Revised: 02/12/2014 Document Reviewed: 08/26/2009 °Exit[employer]® Patient Information ©2015 [employer]. This information is not intended to replace advice given to you by your health care provider. Make sure you discuss any questions you have with your health care provider. ° °

## 2015-04-25 NOTE — Assessment & Plan Note (Signed)
Elevated but did not take medication today, will follow up with PCP for this.

## 2015-04-25 NOTE — Assessment & Plan Note (Signed)
Truck driver, Velcro brace, return in one month, ultrasound-guided hydrodissection if no better.

## 2015-04-25 NOTE — Assessment & Plan Note (Signed)
This will resolve spontaneously

## 2015-04-25 NOTE — Progress Notes (Signed)
  Subjective:    CC: Eye discoloration  HPI: For the past several days this pleasant 59 year old male has noted a discoloration on the lateral aspect of his right sclera, no trauma, no inciting factors. There are no symptoms. Vision is unchanged.  Left wrist pain: With numbness and tingling to the hand, and with holding the steering will for long periods of time, moderate, persistent without radiation.  Past medical history, Surgical history, Family history not pertinant except as noted below, Social history, Allergies, and medications have been entered into the medical record, reviewed, and no changes needed.   Review of Systems: No fevers, chills, night sweats, weight loss, chest pain, or shortness of breath.   Objective:    General: Well Developed, well nourished, and in no acute distress.  Neuro: Alert and oriented x3, extra-ocular muscles intact, sensation grossly intact.  HEENT: Normocephalic, atraumatic, pupils equal round reactive to light, neck supple, no masses, no lymphadenopathy, thyroid nonpalpable. subconjunctival hemorrhage on the right lateral sclera Skin: Warm and dry, no rashes. Cardiac: Regular rate and rhythm, no murmurs rubs or gallops, no lower extremity edema.  Respiratory: Clear to auscultation bilaterally. Not using accessory muscles, speaking in full sentences. Left Wrist: Inspection normal with no visible erythema or swelling. ROM smooth and normal with good flexion and extension and ulnar/radial deviation that is symmetrical with opposite wrist. Palpation is normal over metacarpals, navicular, lunate, and TFCC; tendons without tenderness/ swelling No snuffbox tenderness. No tenderness over Canal of Guyon. Strength 5/5 in all directions without pain. Positive Tinel's and Phalen signs Negative Watson's test.  Impression and Recommendations:   I spent 40 minutes with this patient, greater than 50% was face-to-face time counseling regarding the above  diagnoses

## 2015-05-14 ENCOUNTER — Other Ambulatory Visit: Payer: Self-pay | Admitting: Family Medicine

## 2015-05-27 ENCOUNTER — Ambulatory Visit (INDEPENDENT_AMBULATORY_CARE_PROVIDER_SITE_OTHER): Payer: BLUE CROSS/BLUE SHIELD | Admitting: Sports Medicine

## 2015-05-27 ENCOUNTER — Encounter: Payer: Self-pay | Admitting: Sports Medicine

## 2015-05-27 VITALS — BP 129/80 | HR 73 | Ht 71.0 in | Wt 196.0 lb

## 2015-05-27 DIAGNOSIS — G5602 Carpal tunnel syndrome, left upper limb: Secondary | ICD-10-CM

## 2015-05-27 DIAGNOSIS — F5221 Male erectile disorder: Secondary | ICD-10-CM

## 2015-05-27 DIAGNOSIS — H1131 Conjunctival hemorrhage, right eye: Secondary | ICD-10-CM

## 2015-05-27 DIAGNOSIS — F528 Other sexual dysfunction not due to a substance or known physiological condition: Secondary | ICD-10-CM

## 2015-05-27 MED ORDER — VARDENAFIL HCL 20 MG PO TABS: 20.0000 mg | ORAL_TABLET | Freq: Every day | ORAL | Status: DC | PRN

## 2015-05-27 NOTE — Progress Notes (Signed)
  Subjective:    CC: Follow-up  HPI: Carpal tunnel syndrome, left: Has now resolved with simple using nighttime splinting.  Skin lesion: With itching, scaling under his right eye, no pain.  Erectile dysfunction: Needs a refill on Levitra.  Subconjunctival hemorrhage: Present at the last visit now completely resolved.  Past medical history, Surgical history, Family history not pertinant except as noted below, Social history, Allergies, and medications have been entered into the medical record, reviewed, and no changes needed.   Review of Systems: No fevers, chills, night sweats, weight loss, chest pain, or shortness of breath.   Objective:    General: Well Developed, well nourished, and in no acute distress.  Neuro: Alert and oriented x3, extra-ocular muscles intact, sensation grossly intact.  HEENT: Normocephalic, atraumatic, pupils equal round reactive to light, neck supple, no masses, no lymphadenopathy, thyroid nonpalpable.  Skin: Warm and dry, no rashes. Cardiac: Regular rate and rhythm, no murmurs rubs or gallops, no lower extremity edema.  Respiratory: Clear to auscultation bilaterally. Not using accessory muscles, speaking in full sentences.  Impression and Recommendations:    I spent 25 minutes with this patient, greater than 50% was face-to-face time counseling regarding the above diagnoses

## 2015-05-27 NOTE — Assessment & Plan Note (Signed)
Resolved with the tincture of time

## 2015-05-27 NOTE — Assessment & Plan Note (Signed)
Resolved with conservative measures and nighttime splinting

## 2015-05-27 NOTE — Assessment & Plan Note (Signed)
Refilling Levitra

## 2015-07-24 ENCOUNTER — Other Ambulatory Visit: Payer: Self-pay | Admitting: *Deleted

## 2015-07-24 MED ORDER — CLOBETASOL PROPIONATE 0.05 % EX CREA: 1.0000 "application " | TOPICAL_CREAM | Freq: Two times a day (BID) | CUTANEOUS | Status: DC

## 2015-08-10 ENCOUNTER — Other Ambulatory Visit: Payer: Self-pay | Admitting: Physician Assistant

## 2015-08-13 ENCOUNTER — Telehealth: Payer: Self-pay

## 2015-08-13 MED ORDER — VARDENAFIL HCL 20 MG PO TABS: 20.0000 mg | ORAL_TABLET | Freq: Every day | ORAL | Status: DC | PRN

## 2015-08-13 NOTE — Telephone Encounter (Signed)
Ok for this? 

## 2015-08-13 NOTE — Telephone Encounter (Signed)
Sent to pharmacy 

## 2015-08-13 NOTE — Telephone Encounter (Signed)
Patient would like a refill on Levitra. He wants 10 tablets because of a coupon. Please advise.

## 2015-09-02 ENCOUNTER — Other Ambulatory Visit: Payer: BC Managed Care – PPO

## 2015-09-02 ENCOUNTER — Other Ambulatory Visit: Payer: Self-pay | Admitting: Oncology

## 2015-09-02 ENCOUNTER — Ambulatory Visit: Payer: BC Managed Care – PPO | Admitting: Oncology

## 2015-09-02 ENCOUNTER — Ambulatory Visit: Payer: BLUE CROSS/BLUE SHIELD | Admitting: Oncology

## 2015-09-02 DIAGNOSIS — C61 Malignant neoplasm of prostate: Secondary | ICD-10-CM

## 2015-10-17 ENCOUNTER — Telehealth: Payer: Self-pay | Admitting: Sports Medicine

## 2015-10-17 NOTE — Telephone Encounter (Signed)
Probably needs to be forwarded to billing.  He likely has a deductible that needs to be met.  Once he meets his insurance deductible then they pick up the tab.

## 2015-10-17 NOTE — Telephone Encounter (Signed)
Patient called advised that he has left several messages in regard to a brace he got from our office and his insurance didn't cover the full amount and he ws told by our office that they would pay the whole charge. Pt said he was adv it needs to be resubmitted and he just wants to know what he needs to do to get this resolved. (p) [phone] Please advise. Thanks

## 2015-11-05 ENCOUNTER — Telehealth: Payer: Self-pay | Admitting: *Deleted

## 2015-11-05 NOTE — Telephone Encounter (Signed)
I can see him on 1/27 1:30 if he can make it.

## 2015-11-05 NOTE — Telephone Encounter (Addendum)
"  I will be in the area Friday 11-08-2015 for an 8:30 am appointment.  Can I see Dr. Alen Blew this Friday at 11:30 am?  I called to cancel what was scheduled on 09-02-2015.  Friday works best but if not I can come in on a Mon./Wed./Fri. Early morning.  I have a new mobile number [phone]."

## 2015-11-05 NOTE — Telephone Encounter (Signed)
Called patient who says he will come in 11-08-2015 at 1:00 for lab and 1:30 to see Dr. Alen Blew.  Reports he works out of [place] and would like to be finished by 2:00 pm to get to work.  P.O.F. generated.

## 2015-11-06 ENCOUNTER — Telehealth: Payer: Self-pay | Admitting: Oncology

## 2015-11-06 NOTE — Telephone Encounter (Signed)
Per 1/24 pof patient is aware of 1/27 appts

## 2015-11-08 ENCOUNTER — Telehealth: Payer: Self-pay | Admitting: Oncology

## 2015-11-08 ENCOUNTER — Other Ambulatory Visit (HOSPITAL_BASED_OUTPATIENT_CLINIC_OR_DEPARTMENT_OTHER): Payer: BLUE CROSS/BLUE SHIELD

## 2015-11-08 ENCOUNTER — Ambulatory Visit (HOSPITAL_BASED_OUTPATIENT_CLINIC_OR_DEPARTMENT_OTHER): Payer: BLUE CROSS/BLUE SHIELD | Admitting: Oncology

## 2015-11-08 VITALS — BP 140/90 | HR 65 | Temp 98.1°F | Resp 18 | Ht 71.0 in | Wt 185.6 lb

## 2015-11-08 DIAGNOSIS — Z8546 Personal history of malignant neoplasm of prostate: Secondary | ICD-10-CM | POA: Diagnosis not present

## 2015-11-08 DIAGNOSIS — Z8572 Personal history of non-Hodgkin lymphomas: Secondary | ICD-10-CM

## 2015-11-08 DIAGNOSIS — C61 Malignant neoplasm of prostate: Secondary | ICD-10-CM

## 2015-11-08 LAB — CBC WITH DIFFERENTIAL/PLATELET
BASO%: 1.4 % (ref 0.0–2.0)
BASOS ABS: 0.1 10*3/uL (ref 0.0–0.1)
EOS ABS: 0.3 10*3/uL (ref 0.0–0.5)
EOS%: 2.6 % (ref 0.0–7.0)
HEMATOCRIT: 47.8 % (ref 38.4–49.9)
HGB: 16.5 g/dL (ref 13.0–17.1)
LYMPH#: 2.2 10*3/uL (ref 0.9–3.3)
LYMPH%: 21.3 % (ref 14.0–49.0)
MCH: 31.1 pg (ref 27.2–33.4)
MCHC: 34.6 g/dL (ref 32.0–36.0)
MCV: 89.9 fL (ref 79.3–98.0)
MONO#: 0.9 10*3/uL (ref 0.1–0.9)
MONO%: 8.4 % (ref 0.0–14.0)
NEUT#: 6.8 10*3/uL — ABNORMAL HIGH (ref 1.5–6.5)
NEUT%: 66.3 % (ref 39.0–75.0)
PLATELETS: 231 10*3/uL (ref 140–400)
RBC: 5.31 10*6/uL (ref 4.20–5.82)
RDW: 13.1 % (ref 11.0–14.6)
WBC: 10.3 10*3/uL (ref 4.0–10.3)

## 2015-11-08 LAB — COMPREHENSIVE METABOLIC PANEL
ALT: 22 U/L (ref 0–55)
ANION GAP: 7 meq/L (ref 3–11)
AST: 13 U/L (ref 5–34)
Albumin: 4.1 g/dL (ref 3.5–5.0)
Alkaline Phosphatase: 74 U/L (ref 40–150)
BUN: 10.1 mg/dL (ref 7.0–26.0)
CALCIUM: 9.5 mg/dL (ref 8.4–10.4)
CHLORIDE: 105 meq/L (ref 98–109)
CO2: 24 mEq/L (ref 22–29)
CREATININE: 0.8 mg/dL (ref 0.7–1.3)
EGFR: >90 mL/min/{1.73_m2} (ref >90)
Glucose: 117 mg/dl (ref 70–140)
POTASSIUM: 4.2 meq/L (ref 3.5–5.1)
Sodium: 137 mEq/L (ref 136–145)
Total Bilirubin: 0.76 mg/dL (ref 0.20–1.20)
Total Protein: 7.1 g/dL (ref 6.4–8.3)

## 2015-11-08 NOTE — Telephone Encounter (Signed)
per pof to sch pt appt-gave pt copy of avs °

## 2015-11-08 NOTE — Progress Notes (Signed)
Hematology and Oncology Follow Up Visit  DEMETRICK TIREY [id number] 02/28/1956 60 y.o. 11/08/2015 10:52 AM  CC: [name], D.O. [facility] Rosana Hoes, M.D.    Principle Diagnosis: A 60 year old gentleman with the following diagnoses.  1. 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  He has been in remission since 2006.  Treatment was done in [place], [place]. 2.     History of prostate cancer Gleason score 3 plus 3 equals 6.  PSA was around 1.3 diagnosed in 2003.  He has been on active surveillance under the care of Dr. Alinda Money.   Interim History:  Mr. Cugini presents today for a followup visit. Since his last visit, he reports no recent complaints. He continues to be in excellent health and shape and works regularly. 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 Has not reported any skin rashes or lesions. Does not report any lymphadenopathy or petechiae.   He denied any urinary symptoms at this time and continues to follow with Dr. Alinda Money for prostate cancer standpoint. Continues to be on observation and surveillance.    He does not report any headaches or blurry vision or syncope. He does not report any chest pain, shortness of breath or cough. He does not report any abdominal pain. He does not report any nausea, vomiting or early satiety. He has not reported any recent hospitalizations or illnesses. He does not report any constitutional symptoms, lymphadenopathy or petechiae. Rest of his review of systems unremarkable.  Medications: I have reviewed the patient's current medications.  Current Outpatient Prescriptions  Medication Sig Dispense Refill  . Amino Acids (DAILY AMINO 6000 PO) Take 40 mg by mouth daily.    Marland Kitchen amLODipine (NORVASC) 10 MG tablet Take 1 tablet (10 mg total) by mouth daily. Due for follow up appointment. 90 tablet 0  . aspirin 81 MG tablet Take 81 mg by mouth  daily.      . clobetasol cream (TEMOVATE) [id number] % Apply 1 application topically 2 (two) times daily. 60 g 2  . diazepam (VALIUM) 2 MG tablet TAKE 1 TABLET BY MOUTH AT BEDTIME AS NEEDED 30 tablet 1  . esomeprazole (NEXIUM) 40 MG capsule Take 1 capsule (40 mg total) by mouth daily. 90 capsule 4  . LEVITRA 20 MG tablet Take 20 mg by mouth as directed.  6  . naproxen (NAPROSYN) 250 MG tablet Take 250 mg by mouth every 8 (eight) hours as needed.    . nystatin-triamcinolone ointment (MYCOLOG) Apply topically 2 (two) times daily as needed. 60 g 0  . permethrin (ELIMITE) 5 % cream Apply 1 application topically once. Apply to entire body from neck to feet and leave on overnight, avoid face. 60 g 0  . sertraline (ZOLOFT) 50 MG tablet Take 1 tablet (50 mg total) by mouth daily. Due for follow up appointment. 90 tablet 0   No current facility-administered medications for this visit.    Allergies:  Allergies  Allergen Reactions  . Lisinopril-Hydrochlorothiazide Swelling    Calf swelling.  . Sulfonamide Derivatives Hives and Itching    Past Medical History, Surgical history, Social history, and Family History were reviewed and updated.   Physical Exam: Blood pressure 140/90, pulse 65, temperature 98.1 F (36.7 C), temperature source Oral, resp. rate 18, height 5\' 11"  (1.803 m), weight 185 lb 9.6 oz (84.188 kg), SpO2 99 %. ECOG: 0 General appearance: alert awake gentleman without  distress. Head: Normocephalic, without obvious abnormality Neck: no adenopathy Lymph nodes: Cervical, supraclavicular, and axillary nodes normal. Heart:regular rate and rhythm, S1, S2 normal, no murmur, click, rub or gallop Lung:chest clear, no wheezing, rales, normal symmetric air entry Abdomin: soft, non-tender, without masses or organomegaly shifting dullness or ascites. EXT:no erythema, induration, or nodules  CBC    Component Value Date/Time   WBC 10.3 11/08/2015 1024   WBC 5.6 09/18/2009 1518   RBC 5.31  11/08/2015 1024   RBC 5.21 09/18/2009 1518   HGB 16.5 11/08/2015 1024   HGB 16.1 09/18/2009 1518   HCT 47.8 11/08/2015 1024   HCT 46.0 09/18/2009 1518   PLT 231 11/08/2015 1024   PLT 212 09/18/2009 1518   MCV 89.9 11/08/2015 1024   MCV 88.3 09/18/2009 1518   MCH 31.1 11/08/2015 1024   MCH 31.5 10/31/2010 1055   MCHC 34.6 11/08/2015 1024   MCHC 35.0 09/18/2009 1518   RDW 13.1 11/08/2015 1024   RDW 12.8 09/18/2009 1518   LYMPHSABS 2.2 11/08/2015 1024   LYMPHSABS 1.7 09/18/2009 1518   MONOABS 0.9 11/08/2015 1024   MONOABS 0.5 09/18/2009 1518   EOSABS 0.3 11/08/2015 1024   EOSABS 0.3 09/18/2009 1518   BASOSABS 0.1 11/08/2015 1024   BASOSABS 0.0 09/18/2009 1518     Impression and Plan: This is a 59 year old gentleman with the following issues. 1. History of follicular lymphoma, low grade, a stage 1E treated with external beam radiation.  Physical examination and laboratory testing do not suggest any evidence of recurrent disease. His CBC is within normal range. The plan is continue with active surveillance with annual physical examination and laboratory testing. 2. History of prostate cancer, had a Gleason score of 3 plus 3 equals 6.  He following up with Dr. Alinda Money with active surveillance at this point. His PSA have ranged between 2 and 2.6 without any major changes. 3. Follow up: in one year.      [id number], MD 1/27/201710:52 AM

## 2015-11-09 LAB — PSA: Prostate Specific Ag, Serum: 3.2 ng/mL (ref 0.0–4.0)

## 2015-11-12 ENCOUNTER — Other Ambulatory Visit: Payer: Self-pay | Admitting: Physician Assistant

## 2015-11-14 ENCOUNTER — Telehealth: Payer: Self-pay | Admitting: Physician Assistant

## 2015-11-14 NOTE — Telephone Encounter (Signed)
I called and spoke to pt to inform him he is due for a follow up appointment with [place] So that way he can get his med refills. He did schedule appt for 11-18-15 with [facility] for his follow up. Thanks, Oswaldo Milian

## 2015-11-18 ENCOUNTER — Ambulatory Visit: Payer: BLUE CROSS/BLUE SHIELD | Admitting: Physician Assistant

## 2015-12-23 ENCOUNTER — Other Ambulatory Visit: Payer: Self-pay | Admitting: Physician Assistant

## 2015-12-25 ENCOUNTER — Other Ambulatory Visit: Payer: Self-pay | Admitting: *Deleted

## 2015-12-25 ENCOUNTER — Ambulatory Visit: Payer: BLUE CROSS/BLUE SHIELD | Admitting: Physician Assistant

## 2015-12-25 ENCOUNTER — Telehealth: Payer: Self-pay | Admitting: Physician Assistant

## 2015-12-25 MED ORDER — AMLODIPINE BESYLATE 10 MG PO TABS: ORAL_TABLET | ORAL | Status: DC

## 2015-12-25 MED ORDER — SERTRALINE HCL 50 MG PO TABS: ORAL_TABLET | ORAL | Status: DC

## 2015-12-25 MED ORDER — DIAZEPAM 2 MG PO TABS: 2.0000 mg | ORAL_TABLET | Freq: Every evening | ORAL | Status: DC | PRN

## 2015-12-25 NOTE — Telephone Encounter (Signed)
Patient called gave address to have med sent to [facility] in [place], [place] ([place]. Thanks

## 2015-12-26 NOTE — Telephone Encounter (Signed)
Meds sent

## 2016-01-01 ENCOUNTER — Encounter: Payer: Self-pay | Admitting: Physician Assistant

## 2016-01-01 ENCOUNTER — Ambulatory Visit (INDEPENDENT_AMBULATORY_CARE_PROVIDER_SITE_OTHER): Payer: BLUE CROSS/BLUE SHIELD | Admitting: Physician Assistant

## 2016-01-01 VITALS — BP 114/73 | HR 66 | Ht 71.0 in | Wt 185.0 lb

## 2016-01-01 DIAGNOSIS — Z131 Encounter for screening for diabetes mellitus: Secondary | ICD-10-CM

## 2016-01-01 DIAGNOSIS — G5602 Carpal tunnel syndrome, left upper limb: Secondary | ICD-10-CM

## 2016-01-01 DIAGNOSIS — N5231 Erectile dysfunction following radical prostatectomy: Secondary | ICD-10-CM

## 2016-01-01 DIAGNOSIS — Z13220 Encounter for screening for lipoid disorders: Secondary | ICD-10-CM

## 2016-01-01 DIAGNOSIS — M25512 Pain in left shoulder: Secondary | ICD-10-CM

## 2016-01-01 DIAGNOSIS — I10 Essential (primary) hypertension: Secondary | ICD-10-CM

## 2016-01-01 DIAGNOSIS — K219 Gastro-esophageal reflux disease without esophagitis: Secondary | ICD-10-CM

## 2016-01-01 MED ORDER — SERTRALINE HCL 50 MG PO TABS: ORAL_TABLET | ORAL | Status: DC

## 2016-01-01 MED ORDER — MELOXICAM 15 MG PO TABS: 15.0000 mg | ORAL_TABLET | Freq: Every day | ORAL | Status: DC

## 2016-01-01 MED ORDER — FLUTICASONE PROPIONATE 50 MCG/ACT NA SUSP: 1.0000 | Freq: Every day | NASAL | Status: DC

## 2016-01-01 MED ORDER — DIAZEPAM 2 MG PO TABS: 2.0000 mg | ORAL_TABLET | Freq: Every evening | ORAL | Status: DC | PRN

## 2016-01-01 MED ORDER — ESOMEPRAZOLE MAGNESIUM 40 MG PO CPDR: DELAYED_RELEASE_CAPSULE | ORAL | Status: DC

## 2016-01-01 MED ORDER — AMLODIPINE BESYLATE 10 MG PO TABS: ORAL_TABLET | ORAL | Status: DC

## 2016-01-01 MED ORDER — TADALAFIL 20 MG PO TABS: 20.0000 mg | ORAL_TABLET | Freq: Every day | ORAL | Status: DC | PRN

## 2016-01-01 NOTE — Patient Instructions (Signed)
Bursitis °Bursitis is inflammation and irritation of a bursa, which is one of the small, fluid-filled sacs that cushion and protect the moving parts of your body. These sacs are located between bones and muscles, muscle attachments, or skin areas next to bones. A bursa protects these structures from the wear and tear that results from frequent movement. °An inflamed bursa causes pain and swelling. Fluid may build up inside the sac. Bursitis is most common near joints, especially the knees, elbows, hips, and shoulders. °CAUSES °Bursitis can be caused by:  °· Injury from: °¨ A direct blow, like falling on your knee or elbow. °¨ Overuse of a joint (repetitive stress). °· Infection. This can happen if bacteria gets into a bursa through a cut or scrape near a joint. °· Diseases that cause joint inflammation, such as gout and rheumatoid arthritis. °RISK FACTORS °You may be at risk for bursitis if you:  °· Have a job or hobby that involves a lot of repetitive stress on your joints. °· Have a condition that weakens your body's defense system (immune system), such as diabetes, cancer, or HIV. °· Lift and reach overhead often. °· Kneel or lean on hard surfaces often. °· Run or walk often. °SIGNS AND SYMPTOMS °The most common signs and symptoms of bursitis are: °· Pain that gets worse when you move the affected body part or put weight on it. °· Inflammation. °· Stiffness. °Other signs and symptoms may include: °· Redness. °· Tenderness. °· Warmth. °· Pain that continues after rest. °· Fever and chills. This may occur in bursitis caused by infection. °DIAGNOSIS °Bursitis may be diagnosed by:  °· Medical history and physical exam. °· MRI. °· A procedure to drain fluid from the bursa with a needle (aspiration). The fluid may be checked for signs of infection or gout. °· Blood tests to rule out other causes of inflammation. °TREATMENT  °Bursitis can usually be treated at home with rest, ice, compression, and elevation (Bewley). For  mild bursitis, Prinsen treatment may be all you need. Other treatments may include: °· Nonsteroidal anti-inflammatory drugs (NSAIDs) to treat pain and inflammation. °· Corticosteroids to fight inflammation. You may have these drugs injected into and around the area of bursitis. °· Aspiration of bursitis fluid to relieve pain and improve movement. °· Antibiotic medicine to treat an infected bursa. °· A splint, brace, or walking aid. °· Physical therapy if you continue to have pain or limited movement. °· Surgery to remove a damaged or infected bursa. This may be needed if you have a very bad case of bursitis or if other treatments have not worked. °HOME CARE INSTRUCTIONS  °· Take medicines only as directed by your health care provider. °· If you were prescribed an antibiotic medicine, finish it all even if you start to feel better. °· Rest the affected area as directed by your health care provider. °¨ Keep the area elevated. °¨ Avoid activities that make pain worse. °· Apply ice to the injured area: °¨ Place ice in a plastic bag. °¨ Place a towel between your skin and the bag. °¨ Leave the ice on for 20 minutes, 2-3 times a day. °· Use splints, braces, pads, or walking aids as directed by your health care provider. °· Keep all follow-up visits as directed by your health care provider. This is important. °PREVENTION  °· Wear knee pads if you kneel often. °· Wear sturdy running or walking shoes that fit you well. °· Take regular breaks from repetitive activity. °· Warm   up by stretching before doing any strenuous activity. °· Maintain a healthy weight or lose weight as recommended by your health care provider. Ask your health care provider if you need help. °· Exercise regularly. Start any new physical activity gradually. °SEEK MEDICAL CARE IF:  °· Your bursitis is not responding to treatment or home care. °· You have a fever. °· You have chills. °  °This information is not intended to replace advice given to you by your  health care provider. Make sure you discuss any questions you have with your health care provider. °  °Document Released: 09/25/2000 Document Revised: 06/19/2015 Document Reviewed: 12/18/2013 °Elsevier Interactive Patient Education ©2016 Elsevier Inc. ° °

## 2016-01-01 NOTE — Progress Notes (Signed)
   Subjective:    Patient ID: Jack Grimes, male    DOB: 1956/07/06, 60 y.o.   MRN: [id number]  HPI Pt presents to the clinic for follow up of medications.   GERD- doing well just needs refills.   HTN- no problems or concerns. No CP, palpitations, headaches or dizziness. Taking medication daily.   GAD- doing well on zoloft. No problems.   ED- tried levitra and did work but too expensive are their any other options.   Left carpel tunnel- saw Dr. Gaynelle Adu responded to night splint and NSAId. Seems to be worsening. Hand going numb and tingling at night and even while driving.   He has also had some left shoulder pain for the last month or so. No injuries or trauma. Worse when lifting or doing overhead or behind his back motions. Not tried anything to make better.    Review of Systems  All other systems reviewed and are negative.      Objective:   Physical Exam  Constitutional: He is oriented to person, place, and time. He appears well-developed and well-nourished.  HENT:  Head: Normocephalic and atraumatic.  Eyes: Conjunctivae are normal.  Neck: Normal range of motion. Neck supple.  Cardiovascular: Normal rate, regular rhythm and normal heart sounds.   Pulmonary/Chest: Effort normal and breath sounds normal.  Musculoskeletal:  ROM of left shoulder completed but painful after getting to 100 plus degrees.  Tenderness over anterior and posterior shoulder to palpation.  No pain over acromion or clavicle.  Pt felt some pain with external and internal rotation of left shoulder.  Strength 5/5.  Negative drop arm test.    Neurological: He is alert and oriented to person, place, and time.  Psychiatric: He has a normal mood and affect. His behavior is normal.          Assessment & Plan:  GERD- nexium refilled for 1 year.   HTN- refilled norvasc for 6 months. Discussed signs of hypotension. If has these symptoms cut norvasc in half. BP low today.   GAD- refilled zoloft for 6  months.   Erectile dysfunction- will try cialis and given coupon card.   Left shoulder pain- suspect bursitis of shoulder. Exercises given. Offered injection today but declined. mobic given once a day. Ice and rest for next 2 weeks. Xray ordered. Follow up in 1 month with Dr. Darene Lamer and can do injection if continues.   Left carpel tunnel- follow up with Dr. Darene Lamer for possible hydrodissection vs surgery.

## 2016-01-14 ENCOUNTER — Telehealth: Payer: Self-pay

## 2016-01-14 NOTE — Telephone Encounter (Signed)
Pt called regarding a prescription, called him back to clarify, left message to call office to clarify.

## 2016-04-07 ENCOUNTER — Other Ambulatory Visit: Payer: Self-pay | Admitting: Physician Assistant

## 2016-04-07 ENCOUNTER — Telehealth: Payer: Self-pay | Admitting: Physician Assistant

## 2016-04-07 MED ORDER — SILDENAFIL CITRATE 100 MG PO TABS: 100.0000 mg | ORAL_TABLET | ORAL | Status: DC | PRN

## 2016-04-07 NOTE — Telephone Encounter (Signed)
Ok sent let patient know.

## 2016-04-07 NOTE — Telephone Encounter (Signed)
Pt called to see if he can get a new Rx for generic Viagra sent to a [facility] in [place] (updated in chart). Pt states it will be much cheaper.

## 2016-04-08 NOTE — Telephone Encounter (Signed)
Pt advised. No further questions/concerns.

## 2016-04-10 ENCOUNTER — Other Ambulatory Visit: Payer: Self-pay | Admitting: Physician Assistant

## 2016-04-10 ENCOUNTER — Telehealth: Payer: Self-pay

## 2016-04-10 MED ORDER — SILDENAFIL CITRATE 20 MG PO TABS: ORAL_TABLET | ORAL | Status: DC

## 2016-04-10 NOTE — Telephone Encounter (Signed)
Pharmacy confirmed prescription was right.

## 2016-04-10 NOTE — Telephone Encounter (Signed)
i sent can we call pharmacy and make sure the right one?

## 2016-04-10 NOTE — Telephone Encounter (Signed)
Ashish states he needs the generic to Viagra which is 20 mg. Please send to [place].

## 2016-06-19 ENCOUNTER — Other Ambulatory Visit: Payer: Self-pay | Admitting: Physician Assistant

## 2016-06-22 ENCOUNTER — Ambulatory Visit: Payer: BLUE CROSS/BLUE SHIELD | Admitting: Physician Assistant

## 2016-06-26 ENCOUNTER — Ambulatory Visit: Payer: BLUE CROSS/BLUE SHIELD | Admitting: Physician Assistant

## 2016-08-14 ENCOUNTER — Ambulatory Visit (INDEPENDENT_AMBULATORY_CARE_PROVIDER_SITE_OTHER): Payer: BLUE CROSS/BLUE SHIELD | Admitting: Physician Assistant

## 2016-08-14 ENCOUNTER — Encounter: Payer: Self-pay | Admitting: Physician Assistant

## 2016-08-14 VITALS — BP 125/79 | HR 71 | Ht 71.0 in | Wt 176.0 lb

## 2016-08-14 DIAGNOSIS — Z1159 Encounter for screening for other viral diseases: Secondary | ICD-10-CM | POA: Diagnosis not present

## 2016-08-14 DIAGNOSIS — R0989 Other specified symptoms and signs involving the circulatory and respiratory systems: Secondary | ICD-10-CM | POA: Diagnosis not present

## 2016-08-14 DIAGNOSIS — Z131 Encounter for screening for diabetes mellitus: Secondary | ICD-10-CM | POA: Diagnosis not present

## 2016-08-14 DIAGNOSIS — K219 Gastro-esophageal reflux disease without esophagitis: Secondary | ICD-10-CM

## 2016-08-14 DIAGNOSIS — F411 Generalized anxiety disorder: Secondary | ICD-10-CM

## 2016-08-14 DIAGNOSIS — M545 Low back pain, unspecified: Secondary | ICD-10-CM

## 2016-08-14 DIAGNOSIS — G8929 Other chronic pain: Secondary | ICD-10-CM

## 2016-08-14 DIAGNOSIS — Z13220 Encounter for screening for lipoid disorders: Secondary | ICD-10-CM | POA: Diagnosis not present

## 2016-08-14 DIAGNOSIS — N529 Male erectile dysfunction, unspecified: Secondary | ICD-10-CM

## 2016-08-14 DIAGNOSIS — I10 Essential (primary) hypertension: Secondary | ICD-10-CM

## 2016-08-14 LAB — LIPID PANEL
Cholesterol: 154 mg/dL (ref 125–200)
HDL: 44 mg/dL (ref >=40)
LDL CALC: 96 mg/dL (ref <130)
Total CHOL/HDL Ratio: 3.5 Ratio (ref <=5.0)
Triglycerides: 70 mg/dL (ref <150)
VLDL: 14 mg/dL (ref <30)

## 2016-08-14 LAB — COMPLETE METABOLIC PANEL WITH GFR
ALT: 18 U/L (ref 9–46)
AST: 15 U/L (ref 10–35)
Albumin: 4.3 g/dL (ref 3.6–5.1)
Alkaline Phosphatase: 63 U/L (ref 40–115)
BUN: 8 mg/dL (ref 7–25)
CHLORIDE: 106 mmol/L (ref 98–110)
CO2: 24 mmol/L (ref 20–31)
Calcium: 9.5 mg/dL (ref 8.6–10.3)
Creat: 0.81 mg/dL (ref 0.70–1.33)
GFR, Est African American: >89 mL/min (ref >=60)
GFR, Est Non African American: >89 mL/min (ref >=60)
GLUCOSE: 92 mg/dL (ref 65–99)
POTASSIUM: 4.3 mmol/L (ref 3.5–5.3)
SODIUM: 140 mmol/L (ref 135–146)
Total Bilirubin: 0.7 mg/dL (ref 0.2–1.2)
Total Protein: 6.9 g/dL (ref 6.1–8.1)

## 2016-08-14 LAB — TSH: TSH: 1.12 m[IU]/L (ref 0.40–4.50)

## 2016-08-14 MED ORDER — DIAZEPAM 2 MG PO TABS: 2.0000 mg | ORAL_TABLET | Freq: Every evening | ORAL | 5 refills | Status: DC | PRN

## 2016-08-14 MED ORDER — SILDENAFIL CITRATE 20 MG PO TABS: ORAL_TABLET | ORAL | 3 refills | Status: DC

## 2016-08-14 MED ORDER — SERTRALINE HCL 50 MG PO TABS: ORAL_TABLET | ORAL | 3 refills | Status: DC

## 2016-08-14 MED ORDER — ESOMEPRAZOLE MAGNESIUM 40 MG PO CPDR: DELAYED_RELEASE_CAPSULE | ORAL | 3 refills | Status: DC

## 2016-08-14 MED ORDER — AMLODIPINE BESYLATE 10 MG PO TABS: ORAL_TABLET | ORAL | 3 refills | Status: DC

## 2016-08-14 MED ORDER — MELOXICAM 15 MG PO TABS: 15.0000 mg | ORAL_TABLET | Freq: Every day | ORAL | 1 refills | Status: DC

## 2016-08-14 NOTE — Progress Notes (Signed)
Subjective:    Patient ID: Jack Grimes, male    DOB: Jul 19, 1956, 60 y.o.   MRN: [id number]  HPI  Pt is a 60 yo male who presents to the clinic to get medication refill.   He is doing great today. He does not have any concerns or complaints.  He has been divorced for one year and seems to be doing better after divorce.   .. Active Ambulatory Problems    Diagnosis Date Noted  . NEOPLASM, MALIGNANT, PROSTATE 10/27/2007  . ADJUSTMENT DISORDER WITH ANXIETY 01/21/2009  . VISUAL CHANGES 09/16/2009  . HYPERTENSION, BENIGN ESSENTIAL 06/09/2007  . GERD 10/25/2007  . ALLERGIC URTICARIA 01/21/2009  . BACK PAIN, LUMBAR 10/25/2007  . LYMPHOMA, HX OF 11/14/2007  . GAD (generalized anxiety disorder) 10/24/2012  . 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 Erectile dysfunction 01/23/2013  . BPH (benign prostatic hyperplasia) 01/23/2013  . Right lateral epicondylitis 02/10/2013  . Right knee pain 09/25/2013  . Erectile dysfunction of non-organic origin 05/11/2014  . Multiple joint pain 10/31/2014  . Subconjunctival hemorrhage of right eye 04/25/2015  . Carpal tunnel syndrome, left 04/25/2015  . Left shoulder pain 01/01/2016  . Strong pulse 08/14/2016   Resolved Ambulatory Problems    Diagnosis Date Noted  . No Resolved Ambulatory Problems   Past Medical History:  Diagnosis Date  . BPH (benign prostatic hyperplasia)   . Cancer ([place])   . GERD (gastroesophageal reflux disease)   . Hypertension    . Family History  Problem Relation Age of Onset  . Cancer Father 22    prostate      Review of Systems  All other systems reviewed and are negative.      Objective:   Physical Exam  Constitutional: He is oriented to person, place, and time. He appears well-developed and well-nourished.  HENT:  Head: Normocephalic and atraumatic.  Cardiovascular: Normal rate, regular rhythm and normal heart sounds.   Strong pulse on auscultation. No murmur.   Pulmonary/Chest: Effort normal and breath  sounds normal.  Neurological: He is alert and oriented to person, place, and time.  Psychiatric: He has a normal mood and affect. His behavior is normal.          Assessment & Plan:  Marland KitchenMarland KitchenDiagnoses and all orders for this visit:  Strong pulse -     TSH -     EKG 12-Lead  Screening for lipid disorders -     Lipid panel  Screening for diabetes mellitus -     COMPLETE METABOLIC PANEL WITH GFR  Need for hepatitis C screening test -     Hepatitis C Antibody  HYPERTENSION, BENIGN ESSENTIAL -     amLODipine (NORVASC) 10 MG tablet; Take one tablet by mouth daily  Erectile dysfunction, unspecified erectile dysfunction type -     sildenafil (REVATIO) 20 MG tablet; Take 1-3 tablets as needed .5 hour before sexual intercourse.  Gastroesophageal reflux disease, esophagitis presence not specified -     esomeprazole (NEXIUM) 40 MG capsule; Take one capsule by mouth daily at 12 noon  Chronic bilateral low back pain without sciatica -     meloxicam (MOBIC) 15 MG tablet; Take 1 tablet (15 mg total) by mouth daily.  GAD (generalized anxiety disorder) -     diazepam (VALIUM) 2 MG tablet; Take 1 tablet (2 mg total) by mouth at bedtime as needed. -     sertraline (ZOLOFT) 50 MG tablet; Take one tablet by mouth every day  EKG- NSR, no arrhymias, tall QRS waves in lateral leads. HTN controlled.

## 2016-08-15 LAB — HEPATITIS C ANTIBODY: HCV Ab: NEGATIVE

## 2016-09-23 ENCOUNTER — Telehealth: Payer: Self-pay

## 2016-09-23 MED ORDER — SERTRALINE HCL 100 MG PO TABS: 100.0000 mg | ORAL_TABLET | Freq: Every day | ORAL | 5 refills | Status: DC

## 2016-09-23 NOTE — Telephone Encounter (Signed)
Jack Grimes called and has noticed things have been getting on his nerves a lot easier for the last month. He wanted to know if he could get an increase on his Zoloft. I did advise him he could also look for a therapist in [place].

## 2016-09-23 NOTE — Telephone Encounter (Signed)
Ok to increase to 100mg . Please send over 100mg  tablets to take daily with 5 refills.

## 2016-09-23 NOTE — Telephone Encounter (Signed)
Medication sent and patient is aware 

## 2016-11-06 ENCOUNTER — Ambulatory Visit: Payer: BLUE CROSS/BLUE SHIELD | Admitting: Oncology

## 2016-11-06 ENCOUNTER — Other Ambulatory Visit: Payer: BLUE CROSS/BLUE SHIELD

## 2017-02-20 ENCOUNTER — Other Ambulatory Visit: Payer: Self-pay | Admitting: Physician Assistant

## 2017-04-07 ENCOUNTER — Other Ambulatory Visit: Payer: Self-pay

## 2017-04-07 MED ORDER — SERTRALINE HCL 100 MG PO TABS: 100.0000 mg | ORAL_TABLET | Freq: Every day | ORAL | 1 refills | Status: DC

## 2017-10-21 ENCOUNTER — Other Ambulatory Visit: Payer: Self-pay | Admitting: Physician Assistant

## 2017-10-21 DIAGNOSIS — F411 Generalized anxiety disorder: Secondary | ICD-10-CM

## 2017-10-21 DIAGNOSIS — I10 Essential (primary) hypertension: Secondary | ICD-10-CM

## 2017-10-27 ENCOUNTER — Ambulatory Visit: Payer: BLUE CROSS/BLUE SHIELD | Admitting: Physician Assistant

## 2017-10-29 ENCOUNTER — Encounter: Payer: Self-pay | Admitting: Physician Assistant

## 2017-10-29 ENCOUNTER — Ambulatory Visit: Payer: BLUE CROSS/BLUE SHIELD | Admitting: Physician Assistant

## 2017-10-29 ENCOUNTER — Ambulatory Visit (INDEPENDENT_AMBULATORY_CARE_PROVIDER_SITE_OTHER): Payer: BLUE CROSS/BLUE SHIELD

## 2017-10-29 VITALS — BP 139/75 | HR 71 | Ht 71.0 in | Wt 183.0 lb

## 2017-10-29 DIAGNOSIS — I10 Essential (primary) hypertension: Secondary | ICD-10-CM | POA: Diagnosis not present

## 2017-10-29 DIAGNOSIS — G8929 Other chronic pain: Secondary | ICD-10-CM

## 2017-10-29 DIAGNOSIS — K219 Gastro-esophageal reflux disease without esophagitis: Secondary | ICD-10-CM

## 2017-10-29 DIAGNOSIS — M545 Low back pain, unspecified: Secondary | ICD-10-CM

## 2017-10-29 DIAGNOSIS — M25512 Pain in left shoulder: Secondary | ICD-10-CM | POA: Diagnosis not present

## 2017-10-29 DIAGNOSIS — Z125 Encounter for screening for malignant neoplasm of prostate: Secondary | ICD-10-CM | POA: Diagnosis not present

## 2017-10-29 DIAGNOSIS — Z13220 Encounter for screening for lipoid disorders: Secondary | ICD-10-CM | POA: Diagnosis not present

## 2017-10-29 DIAGNOSIS — N529 Male erectile dysfunction, unspecified: Secondary | ICD-10-CM | POA: Diagnosis not present

## 2017-10-29 DIAGNOSIS — Z79899 Other long term (current) drug therapy: Secondary | ICD-10-CM

## 2017-10-29 DIAGNOSIS — F411 Generalized anxiety disorder: Secondary | ICD-10-CM | POA: Diagnosis not present

## 2017-10-29 DIAGNOSIS — Z131 Encounter for screening for diabetes mellitus: Secondary | ICD-10-CM

## 2017-10-29 DIAGNOSIS — J3089 Other allergic rhinitis: Secondary | ICD-10-CM

## 2017-10-29 DIAGNOSIS — M19012 Primary osteoarthritis, left shoulder: Secondary | ICD-10-CM

## 2017-10-29 DIAGNOSIS — T148XXA Other injury of unspecified body region, initial encounter: Secondary | ICD-10-CM | POA: Diagnosis not present

## 2017-10-29 LAB — CBC
HEMATOCRIT: 45.9 % (ref 38.5–50.0)
HEMOGLOBIN: 15.9 g/dL (ref 13.2–17.1)
MCH: 30.3 pg (ref 27.0–33.0)
MCHC: 34.6 g/dL (ref 32.0–36.0)
MCV: 87.6 fL (ref 80.0–100.0)
MPV: 11 fL (ref 7.5–12.5)
Platelets: 242 10*3/uL (ref 140–400)
RBC: 5.24 10*6/uL (ref 4.20–5.80)
RDW: 12.3 % (ref 11.0–15.0)
WBC: 12.4 10*3/uL — AB (ref 3.8–10.8)

## 2017-10-29 LAB — COMPLETE METABOLIC PANEL WITH GFR
AG Ratio: 1.9 (calc) (ref 1.0–2.5)
ALBUMIN MSPROF: 4.3 g/dL (ref 3.6–5.1)
ALKALINE PHOSPHATASE (APISO): 57 U/L (ref 40–115)
ALT: 21 U/L (ref 9–46)
AST: 11 U/L (ref 10–35)
BILIRUBIN TOTAL: 0.5 mg/dL (ref 0.2–1.2)
BUN: 14 mg/dL (ref 7–25)
CO2: 27 mmol/L (ref 20–32)
CREATININE: 0.88 mg/dL (ref 0.70–1.25)
Calcium: 9.2 mg/dL (ref 8.6–10.3)
Chloride: 105 mmol/L (ref 98–110)
GFR, Est African American: 108 mL/min/{1.73_m2} (ref >=60)
GFR, Est Non African American: 93 mL/min/{1.73_m2} (ref >=60)
Globulin: 2.3 g/dL (calc) (ref 1.9–3.7)
Glucose, Bld: 92 mg/dL (ref 65–99)
Potassium: 4.4 mmol/L (ref 3.5–5.3)
SODIUM: 138 mmol/L (ref 135–146)
Total Protein: 6.6 g/dL (ref 6.1–8.1)

## 2017-10-29 LAB — LIPID PANEL W/REFLEX DIRECT LDL
CHOL/HDL RATIO: 4 (calc) (ref <5.0)
Cholesterol: 155 mg/dL (ref <200)
HDL: 39 mg/dL — AB (ref >40)
LDL CHOLESTEROL (CALC): 99 mg/dL
Non-HDL Cholesterol (Calc): 116 mg/dL (calc) (ref <130)
TRIGLYCERIDES: 82 mg/dL (ref <150)

## 2017-10-29 LAB — PSA: PSA: 4 ng/mL (ref <=4.0)

## 2017-10-29 MED ORDER — ESOMEPRAZOLE MAGNESIUM 40 MG PO CPDR: DELAYED_RELEASE_CAPSULE | ORAL | 3 refills | Status: DC

## 2017-10-29 MED ORDER — CLOBETASOL PROPIONATE 0.05 % EX CREA: 1.0000 "application " | TOPICAL_CREAM | Freq: Two times a day (BID) | CUTANEOUS | 2 refills | Status: DC

## 2017-10-29 MED ORDER — DIAZEPAM 2 MG PO TABS: 2.0000 mg | ORAL_TABLET | Freq: Every evening | ORAL | 5 refills | Status: DC | PRN

## 2017-10-29 MED ORDER — TADALAFIL 20 MG PO TABS: 20.0000 mg | ORAL_TABLET | Freq: Every day | ORAL | 5 refills | Status: DC | PRN

## 2017-10-29 MED ORDER — MELOXICAM 15 MG PO TABS: 15.0000 mg | ORAL_TABLET | Freq: Every day | ORAL | 1 refills | Status: DC

## 2017-10-29 MED ORDER — FLUTICASONE PROPIONATE 50 MCG/ACT NA SUSP: 1.0000 | Freq: Every day | NASAL | 11 refills | Status: DC

## 2017-10-29 MED ORDER — AMLODIPINE BESYLATE 10 MG PO TABS: ORAL_TABLET | ORAL | 3 refills | Status: DC

## 2017-10-29 MED ORDER — SILDENAFIL CITRATE 20 MG PO TABS: ORAL_TABLET | ORAL | 3 refills | Status: DC

## 2017-10-29 NOTE — Patient Instructions (Signed)
Seborrheic Dermatitis, Adult Seborrheic dermatitis is a skin disease that causes red, scaly patches. It usually occurs on the scalp, and it is often called dandruff. The patches may appear on other parts of the body. Skin patches tend to appear where there are many oil glands in the skin. Areas of the body that are commonly affected include:  Scalp.  Skin folds of the body.  Ears.  Eyebrows.  Neck.  Face.  Armpits.  The bearded area of men's faces.  The condition may come and go for no known reason, and it is often long-lasting (chronic). What are the causes? The cause of this condition is not known. What increases the risk? This condition is more likely to develop in people who:  Have certain conditions, such as: ? HIV (human immunodeficiency virus). ? AIDS (acquired immunodeficiency syndrome). ? Parkinson disease. ? Mood disorders, such as depression.  Are 40-60 years old.  What are the signs or symptoms? Symptoms of this condition include:  Thick scales on the scalp.  Redness on the face or in the armpits.  Skin that is flaky. The flakes may be white or yellow.  Skin that seems oily or dry but is not helped with moisturizers.  Itching or burning in the affected areas.  How is this diagnosed? This condition is diagnosed with a medical history and physical exam. A sample of your skin may be tested (skin biopsy). You may need to see a skin specialist (dermatologist). How is this treated? There is no cure for this condition, but treatment can help to manage the symptoms. You may get treatment to remove scales, lower the risk of skin infection, and reduce swelling or itching. Treatment may include:  Creams that reduce swelling and irritation (steroids).  Creams that reduce skin yeast.  Medicated shampoo, soaps, moisturizing creams, or ointments.  Medicated moisturizing creams or ointments.  Follow these instructions at home:  Apply over-the-counter and  prescription medicines only as told by your health care provider.  Use any medicated shampoo, soaps, skin creams, or ointments only as told by your health care provider.  Keep all follow-up visits as told by your health care provider. This is important. Contact a health care provider if:  Your symptoms do not improve with treatment.  Your symptoms get worse.  You have new symptoms. This information is not intended to replace advice given to you by your health care provider. Make sure you discuss any questions you have with your health care provider. Document Released: 09/28/2005 Document Revised: 04/17/2016 Document Reviewed: 01/16/2016 Elsevier Interactive Patient Education  2018 Elsevier Inc.  

## 2017-10-29 NOTE — Progress Notes (Signed)
Call pt: lots of arthritis in left shoulder. Continue mobic. Injection would really help! Sports medicine here in office could do this.

## 2017-10-31 ENCOUNTER — Encounter: Payer: Self-pay | Admitting: Physician Assistant

## 2017-10-31 NOTE — Progress Notes (Signed)
Subjective:    Patient ID: Jack Grimes, male    DOB: 03/18/1956, 62 y.o.   MRN: [id number]  HPI Pt is a 62 yo male with HTN, GERD, ED, GAD who presents for is 6 month follow up.   HTN- doing well on medication. No CP, palpitations, headaches, vision changes.   GERD-controlled on medication.   ED- medications work when needed. They are costly. He is dating and not using regularly.   GAD- he stopped his zoloft after at his [employer] physical they told him he would have to come back more frequently because he "was on a mind altering drug". He feels like he is doing fine. He still takes valium at night.   He does continue to have left shoulder pain. ROM is decreasing as well. Pain has been ongoing for over a year. No known injury. mobic does help but does not like to take everyday.   He does also have a painful abrasion to his right eye. Not tried anything to make better.   .. Active Ambulatory Problems    Diagnosis Date Noted  . NEOPLASM, MALIGNANT, PROSTATE 10/27/2007  . ADJUSTMENT DISORDER WITH ANXIETY 01/21/2009  . VISUAL CHANGES 09/16/2009  . HYPERTENSION, BENIGN ESSENTIAL 06/09/2007  . GERD 10/25/2007  . ALLERGIC URTICARIA 01/21/2009  . BACK PAIN, LUMBAR 10/25/2007  . LYMPHOMA, HX OF 11/14/2007  . GAD (generalized anxiety disorder) 10/24/2012  . 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 Erectile dysfunction 01/23/2013  . BPH (benign prostatic hyperplasia) 01/23/2013  . Right lateral epicondylitis 02/10/2013  . Right knee pain 09/25/2013  . Erectile dysfunction of non-organic origin 05/11/2014  . Multiple joint pain 10/31/2014  . Subconjunctival hemorrhage of right eye 04/25/2015  . Carpal tunnel syndrome, left 04/25/2015  . Left shoulder pain 01/01/2016  . Strong pulse 08/14/2016  . 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/29/2017   Resolved Ambulatory Problems    Diagnosis Date Noted  . No Resolved Ambulatory Problems   Past Medical History:  Diagnosis Date  . BPH  (benign prostatic hyperplasia)   . Cancer ([place])   . GERD (gastroesophageal reflux disease)   . Hypertension       Review of Systems  All other systems reviewed and are negative.      Objective:   Physical Exam  Constitutional: He is oriented to person, place, and time. He appears well-developed and well-nourished.  HENT:  Head: Normocephalic and atraumatic.  Eyes: Conjunctivae are normal. Right eye exhibits no discharge. Left eye exhibits no discharge.  Abrasion with redness over right eyelid.   Neck: Normal range of motion. Neck supple.  Cardiovascular: Normal rate, regular rhythm and normal heart sounds.  Pulmonary/Chest: Effort normal and breath sounds normal. He has no wheezes.  Musculoskeletal:  Decreased ROM of left shoulder around 110 degrees abduction.  Tenderness to palpation over anterior shoulder.  Negative drop arm test.  Strength 5/5 upper extermities.  Positive hawkins.   Lymphadenopathy:    He has no cervical adenopathy.  Neurological: He is alert and oriented to person, place, and time.  Psychiatric: He has a normal mood and affect.          Assessment & Plan:  Marland KitchenMarland KitchenDiagnoses and all orders for this visit:  GAD (generalized anxiety disorder) -     diazepam (VALIUM) 2 MG tablet; Take 1 tablet (2 mg total) by mouth at bedtime as needed.  Chronic bilateral low back pain without sciatica -     meloxicam (MOBIC) 15 MG tablet; Take 1  tablet (15 mg total) by mouth daily.  Erectile dysfunction, unspecified erectile dysfunction type -     sildenafil (REVATIO) 20 MG tablet; Take 1-3 tablets as needed .5 hour before sexual intercourse.  HYPERTENSION, BENIGN ESSENTIAL -     amLODipine (NORVASC) 10 MG tablet; Take one tablet by mouth daily -     tadalafil (CIALIS) 20 MG tablet; Take 1 tablet (20 mg total) by mouth daily as needed for erectile dysfunction.  Gastroesophageal reflux disease, esophagitis presence not specified -     esomeprazole (NEXIUM) 40 MG  capsule; Take one capsule by mouth daily at 12 noon  Chronic left shoulder pain -     DG Shoulder Left  Screening for diabetes mellitus -     COMPLETE METABOLIC PANEL WITH GFR  Screening for lipid disorders -     Lipid Panel w/reflex Direct LDL  Prostate cancer screening -     PSA -     CBC  Non-seasonal allergic rhinitis, unspecified trigger -     fluticasone (FLONASE) 50 MCG/ACT nasal spray; Place 1 spray into both nostrils daily.  Medication management -     COMPLETE METABOLIC PANEL WITH GFR -     CBC  Abrasion  Other orders -     clobetasol cream (TEMOVATE) 0.05 %; Apply 1 application topically 2 (two) times daily.   .. Depression screen [facility] 2/9 10/29/2017 08/31/2014  Decreased Interest 0 0  Down, Depressed, Hopeless 0 0  PHQ - 2 Score 0 0  Altered sleeping 0 -  Tired, decreased energy 0 -  Change in appetite 0 -  Feeling bad or failure about yourself  0 -  Trouble concentrating 0 -  Moving slowly or fidgety/restless 0 -  Suicidal thoughts 0 -  PHQ-9 Score 0 -  Difficult doing work/chores Not difficult at all -   Fasting labs ordered.   Xray for chronic left shoulder pain. Offered injection today he declined. Suspect some impingment syndrome and possible adhesive capsulitis due to decreased ROM. Continue mobic as needed. Exercises given. Follow up with sports medicine.   Medications refilled.   Topical antibiotic for abrasion over right eyelid. Call if swelling, redness, tenderness worsens or does not improve.   Refilled valium discussed using melatonin/unisom for sleep.   Marland KitchenMarland KitchenSpent 30 minutes with patient and greater than 50 percent of visit spent counseling patient regarding treatment plan.

## 2018-05-04 ENCOUNTER — Ambulatory Visit: Payer: BLUE CROSS/BLUE SHIELD | Admitting: Physician Assistant

## 2018-05-05 ENCOUNTER — Other Ambulatory Visit: Payer: Self-pay | Admitting: Physician Assistant

## 2018-05-05 DIAGNOSIS — M545 Low back pain, unspecified: Secondary | ICD-10-CM

## 2018-05-05 DIAGNOSIS — G8929 Other chronic pain: Secondary | ICD-10-CM

## 2018-05-24 ENCOUNTER — Ambulatory Visit: Payer: BLUE CROSS/BLUE SHIELD | Admitting: Physician Assistant

## 2018-05-24 ENCOUNTER — Encounter: Payer: Self-pay | Admitting: Physician Assistant

## 2018-05-24 VITALS — BP 133/86 | HR 68 | Ht 71.0 in | Wt 178.0 lb

## 2018-05-24 DIAGNOSIS — I10 Essential (primary) hypertension: Secondary | ICD-10-CM | POA: Diagnosis not present

## 2018-05-24 DIAGNOSIS — K219 Gastro-esophageal reflux disease without esophagitis: Secondary | ICD-10-CM | POA: Diagnosis not present

## 2018-05-24 DIAGNOSIS — M19012 Primary osteoarthritis, left shoulder: Secondary | ICD-10-CM

## 2018-05-24 DIAGNOSIS — M545 Low back pain, unspecified: Secondary | ICD-10-CM

## 2018-05-24 DIAGNOSIS — C61 Malignant neoplasm of prostate: Secondary | ICD-10-CM

## 2018-05-24 DIAGNOSIS — Z13220 Encounter for screening for lipoid disorders: Secondary | ICD-10-CM

## 2018-05-24 DIAGNOSIS — F411 Generalized anxiety disorder: Secondary | ICD-10-CM | POA: Diagnosis not present

## 2018-05-24 DIAGNOSIS — Z0000 Encounter for general adult medical examination without abnormal findings: Secondary | ICD-10-CM

## 2018-05-24 DIAGNOSIS — N5231 Erectile dysfunction following radical prostatectomy: Secondary | ICD-10-CM

## 2018-05-24 DIAGNOSIS — G8929 Other chronic pain: Secondary | ICD-10-CM

## 2018-05-24 DIAGNOSIS — Z131 Encounter for screening for diabetes mellitus: Secondary | ICD-10-CM

## 2018-05-24 MED ORDER — MELOXICAM 15 MG PO TABS: 15.0000 mg | ORAL_TABLET | Freq: Every day | ORAL | 3 refills | Status: DC

## 2018-05-24 MED ORDER — TADALAFIL 20 MG PO TABS: 20.0000 mg | ORAL_TABLET | Freq: Every day | ORAL | 5 refills | Status: DC | PRN

## 2018-05-24 MED ORDER — ESOMEPRAZOLE MAGNESIUM 40 MG PO CPDR: DELAYED_RELEASE_CAPSULE | ORAL | 3 refills | Status: DC

## 2018-05-24 MED ORDER — AMLODIPINE BESYLATE 10 MG PO TABS: ORAL_TABLET | ORAL | 3 refills | Status: DC

## 2018-05-24 MED ORDER — DIAZEPAM 2 MG PO TABS: 2.0000 mg | ORAL_TABLET | Freq: Every evening | ORAL | 2 refills | Status: DC | PRN

## 2018-05-24 NOTE — Progress Notes (Signed)
Call pt: cholesterol looks great. Kidney, liver, glucose wonderful.

## 2018-05-24 NOTE — Progress Notes (Signed)
Subjective:    Patient ID: Jack Grimes, male    DOB: 03-26-1956, 62 y.o.   MRN: [id number]  HPI Pt is a 62 yo male who presents to the clinic for 6 month follow up.   .. Active Ambulatory Problems    Diagnosis Date Noted  . NEOPLASM, MALIGNANT, PROSTATE 10/27/2007  . ADJUSTMENT DISORDER WITH ANXIETY 01/21/2009  . VISUAL CHANGES 09/16/2009  . HYPERTENSION, BENIGN ESSENTIAL 06/09/2007  . GERD 10/25/2007  . ALLERGIC URTICARIA 01/21/2009  . BACK PAIN, LUMBAR 10/25/2007  . LYMPHOMA, HX OF 11/14/2007  . GAD (generalized anxiety disorder) 10/24/2012  . 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 Erectile dysfunction 01/23/2013  . BPH (benign prostatic hyperplasia) 01/23/2013  . Right lateral epicondylitis 02/10/2013  . Right knee pain 09/25/2013  . Erectile dysfunction of non-organic origin 05/11/2014  . Multiple joint pain 10/31/2014  . Subconjunctival hemorrhage of right eye 04/25/2015  . Carpal tunnel syndrome, left 04/25/2015  . Left shoulder pain 01/01/2016  . Strong pulse 08/14/2016  . 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/29/2017   Resolved Ambulatory Problems    Diagnosis Date Noted  . No Resolved Ambulatory Problems   Past Medical History:  Diagnosis Date  . Cancer ([place])   . GERD (gastroesophageal reflux disease)   . Hypertension    .Marland Kitchen Family History  Problem Relation Age of Onset  . Cancer Father 87       prostate   .Marland Kitchen Social History   Socioeconomic History  . Marital status: Married    Spouse name: Not on file  . Number of children: Not on file  . Years of education: Not on file  . Highest education level: Not on file  Occupational History  . Not on file  Social Needs  . Financial resource strain: Not on file  . Food insecurity:    Worry: Not on file    Inability: Not on file  . Transportation needs:    Medical: Not on file    Non-medical: Not on file  Tobacco Use  . Smoking status: Former Smoker    Types: Cigarettes    Last attempt to  quit: 10/12/2002    Years since quitting: 15.6  . Smokeless tobacco: Never Used  Substance and Sexual Activity  . Alcohol use: Yes    Alcohol/week: 4.0 standard drinks    Types: 4 Cans of beer per week    Comment: per week  . Drug use: Not on file  . 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   Lifestyle  . Physical activity:    Days per week: Not on file    Minutes per session: Not on file  . Stress: Not on file  Relationships  . Social connections:    Talks on phone: Not on file    Gets together: Not on file    Attends religious service: Not on file    Active member of club or organization: Not on file    Attends meetings of clubs or organizations: Not on file    Relationship status: Not on file  . Intimate partner violence:    Fear of current or ex partner: Not on file    Emotionally abused: Not on file    Physically abused: Not on file    Forced sexual activity: Not on file  Other Topics Concern  . Not on file  Social History Narrative  . Not on file   He continues to  have left shoulder pain and loss of ROM. He takes mobic which helps but continues to bother him.   He did stop zoloft. Mood doing well. No SI/HC. He is working a lot and happy.   GERD- no problems or concerns. Taking nexium.   Hx of prostate cancer. Dr. Alinda Money is urologist. Not seen in last 3 years.   He occasionally uses valium for anxiety and sleep. Usually goes through 2 refills every 6 months.     Review of Systems  All other systems reviewed and are negative.      Objective:   Physical Exam  Constitutional: He is oriented to person, place, and time. He appears well-developed and well-nourished.  HENT:  Head: Normocephalic and atraumatic.  Nose: Nose normal.  Mouth/Throat: Oropharynx is clear and moist. No oropharyngeal exudate.  Eyes: Pupils are equal, round, and reactive to light. Conjunctivae and EOM are normal.  Neck: No thyromegaly present.   Cardiovascular: Normal rate and regular rhythm.  Pulmonary/Chest: Effort normal and breath sounds normal.  Abdominal: Soft. Bowel sounds are normal.  Lymphadenopathy:    He has no cervical adenopathy.  Neurological: He is alert and oriented to person, place, and time.  Psychiatric: He has a normal mood and affect. His behavior is normal.  ROM of left shoulder decrease. Abduction around 120 degrees. Lots of resistance with external ROM. strength 4/5.         Assessment & Plan:  Marland KitchenMarland KitchenDiagnoses and all orders for this visit:  HYPERTENSION, BENIGN ESSENTIAL -     tadalafil (ADCIRCA/CIALIS) 20 MG tablet; Take 1 tablet (20 mg total) by mouth daily as needed for erectile dysfunction. -     amLODipine (NORVASC) 10 MG tablet; Take one tablet by mouth daily -     COMPLETE METABOLIC PANEL WITH GFR  Chronic bilateral low back pain without sciatica -     meloxicam (MOBIC) 15 MG tablet; Take 1 tablet (15 mg total) by mouth daily.  GAD (generalized anxiety disorder) -     diazepam (VALIUM) 2 MG tablet; Take 1 tablet (2 mg total) by mouth at bedtime as needed.  Gastroesophageal reflux disease, esophagitis presence not specified -     esomeprazole (NEXIUM) 40 MG capsule; Take one capsule by mouth daily at 12 noon  Localized osteoarthritis of left shoulder  NEOPLASM, MALIGNANT, PROSTATE -     Cancel: PSA, total and free -     Cancel: CBC with Differential/Platelet -     PSA, total and free -     CBC with Differential/Platelet  Erectile dysfunction after radical prostatectomy  Screening for lipid disorders -     Lipid Panel w/reflex Direct LDL  Screening for diabetes mellitus -     Cancel: COMPLETE METABOLIC PANEL WITH GFR -     COMPLETE METABOLIC PANEL WITH GFR  Preventative health care -     Cancel: PSA, total and free -     Cancel: CBC with Differential/Platelet -     Lipid Panel w/reflex Direct LDL -     Cancel: COMPLETE METABOLIC PANEL WITH GFR -     COMPLETE METABOLIC PANEL WITH  GFR -     PSA, total and free -     CBC with Differential/Platelet  .Marland Kitchen Depression screen [facility] 2/9 05/24/2018 10/29/2017 08/31/2014  Decreased Interest 0 0 0  Down, Depressed, Hopeless 0 0 0  PHQ - 2 Score 0 0 0  Altered sleeping 0 0 -  Tired, decreased energy 0 0 -  Change  in appetite 0 0 -  Feeling bad or failure about yourself  0 0 -  Trouble concentrating 0 0 -  Moving slowly or fidgety/restless 0 0 -  Suicidal thoughts 0 0 -  PHQ-9 Score 0 0 -  Difficult doing work/chores Not difficult at all Not difficult at all -   .. GAD 7 : Generalized Anxiety Score 05/24/2018  Nervous, Anxious, on Edge 0  Control/stop worrying 0  Worry too much - different things 0  Trouble relaxing 0  Restless 0  Easily annoyed or irritable 0  Afraid - awful might happen 0  Total GAD 7 Score 0  Anxiety Difficulty Not difficult at all      Lipid was done 6 months ago and so was canceled. Cmp, psa, cbc ordered today. Discussed he does need to follow up with urologist PSA was 4.00 at at last visit.  Medication refilled today.  Continue to use valium sparingly as needed.   Mood seems to be doing great. Took zoloft off list.   Strongly encouraged follow up with sports medicine for left shoulder pain due to OA and now frozen shoulder. Pt declined referral and PT. Gave home exercises to start. Continue mobic. Heat could help before and after ROM exercises.   Follow up in 6 months.

## 2018-05-24 NOTE — Patient Instructions (Addendum)
Health Maintenance, Male A healthy lifestyle and preventive care is important for your health and wellness. Ask your health care provider about what schedule of regular examinations is right for you. What should I know about weight and diet? Eat a Healthy Diet  Eat plenty of vegetables, fruits, whole grains, low-fat dairy products, and lean protein.  Do not eat a lot of foods high in solid fats, added sugars, or salt.  Maintain a Healthy Weight Regular exercise can help you achieve or maintain a healthy weight. You should:  Do at least 150 minutes of exercise each week. The exercise should increase your heart rate and make you sweat (moderate-intensity exercise).  Do strength-training exercises at least twice a week.  Watch Your Levels of Cholesterol and Blood Lipids  Have your blood tested for lipids and cholesterol every 5 years starting at 61 years of age. If you are at high risk for heart disease, you should start having your blood tested when you are 61 years old. You may need to have your cholesterol levels checked more often if: ? Your lipid or cholesterol levels are high. ? You are older than 61 years of age. ? You are at high risk for heart disease.  What should I know about cancer screening? Many types of cancers can be detected early and may often be prevented. Lung Cancer  You should be screened every year for lung cancer if: ? You are a current smoker who has smoked for at least 30 years. ? You are a former smoker who has quit within the past 15 years.  Talk to your health care provider about your screening options, when you should start screening, and how often you should be screened.  Colorectal Cancer  Routine colorectal cancer screening usually begins at 61 years of age and should be repeated every 5-10 years until you are 61 years old. You may need to be screened more often if early forms of precancerous polyps or small growths are found. Your health care provider  may recommend screening at an earlier age if you have risk factors for colon cancer.  Your health care provider may recommend using home test kits to check for hidden blood in the stool.  A small camera at the end of a tube can be used to examine your colon (sigmoidoscopy or colonoscopy). This checks for the earliest forms of colorectal cancer.  Prostate and Testicular Cancer  Depending on your age and overall health, your health care provider may do certain tests to screen for prostate and testicular cancer.  Talk to your health care provider about any symptoms or concerns you have about testicular or prostate cancer.  Skin Cancer  Check your skin from head to toe regularly.  Tell your health care provider about any new moles or changes in moles, especially if: ? There is a change in a mole's size, shape, or color. ? You have a mole that is larger than a pencil eraser.  Always use sunscreen. Apply sunscreen liberally and repeat throughout the day.  Protect yourself by wearing long sleeves, pants, a wide-brimmed hat, and sunglasses when outside.  What should I know about heart disease, diabetes, and high blood pressure?  If you are 18-39 years of age, have your blood pressure checked every 3-5 years. If you are 40 years of age or older, have your blood pressure checked every year. You should have your blood pressure measured twice-once when you are at a hospital or clinic, and once when   you are not at a hospital or clinic. Record the average of the two measurements. To check your blood pressure when you are not at a hospital or clinic, you can use: ? An automated blood pressure machine at a pharmacy. ? A home blood pressure monitor.  Talk to your health care provider about your target blood pressure.  If you are between 84-78 years old, ask your health care provider if you should take aspirin to prevent heart disease.  Have regular diabetes screenings by checking your fasting blood  sugar level. ? If you are at a normal weight and have a low risk for diabetes, have this test once every three years after the age of 66. ? If you are overweight and have a high risk for diabetes, consider being tested at a younger age or more often.  A one-time screening for abdominal aortic aneurysm (AAA) by ultrasound is recommended for men aged 83-75 years who are current or former smokers. What should I know about preventing infection? Hepatitis B If you have a higher risk for hepatitis B, you should be screened for this virus. Talk with your health care provider to find out if you are at risk for hepatitis B infection. Hepatitis C Blood testing is recommended for:  Everyone born from [birth year] through 1964.  Anyone with known risk factors for hepatitis C.  Sexually Transmitted Diseases (STDs)  You should be screened each year for STDs including gonorrhea and chlamydia if: ? You are sexually active and are younger than 61 years of age. ? You are older than 61 years of age and your health care provider tells you that you are at risk for this type of infection. ? Your sexual activity has changed since you were last screened and you are at an increased risk for chlamydia or gonorrhea. Ask your health care provider if you are at risk.  Talk with your health care provider about whether you are at high risk of being infected with HIV. Your health care provider may recommend a prescription medicine to help prevent HIV infection.  What else can I do?  Schedule regular health, dental, and eye exams.  Stay current with your vaccines (immunizations).  Do not use any tobacco products, such as cigarettes, chewing tobacco, and e-cigarettes. If you need help quitting, ask your health care provider.  Limit alcohol intake to no more than 2 drinks per day. One drink equals 12 ounces of beer, 5 ounces of wine, or 1 ounces of hard liquor.  Do not use street drugs.  Do not share needles.  Ask your  health care provider for help if you need support or information about quitting drugs.  Tell your health care provider if you often feel depressed.  Tell your health care provider if you have ever been abused or do not feel safe at home. This information is not intended to replace advice given to you by your health care provider. Make sure you discuss any questions you have with your health care provider. Document Released: 03/26/2008 Document Revised: 05/27/2016 Document Reviewed: 07/02/2015 Elsevier Interactive Patient Education  2018 [employer]. Adhesive Capsulitis Adhesive capsulitis is inflammation of the tendons and ligaments that surround the shoulder joint (shoulder capsule). This condition causes the shoulder to become stiff and painful to move. Adhesive capsulitis is also called frozen shoulder. What are the causes? This condition may be caused by:  An injury to the shoulder joint.  Straining the shoulder.  Not moving the shoulder for a  period of time. This can happen if your arm was injured or in a sling.  Long-standing health problems, such as: ? Diabetes. ? Thyroid problems. ? Heart disease. ? Stroke. ? Rheumatoid arthritis. ? Lung disease.  In some cases, the cause may not be known. What increases the risk? This condition is more likely to develop in:  Women.  People who are older than 61 years of age.  What are the signs or symptoms? Symptoms of this condition include:  Pain in the shoulder when moving the arm. There may also be pain when parts of the shoulder are touched. The pain is worse at night or when at rest.  Soreness or aching in the shoulder.  Inability to move the shoulder normally.  Muscle spasms.  How is this diagnosed? This condition is diagnosed with a physical exam and imaging tests, such as an X-ray or MRI. How is this treated? This condition may be treated with:  Treatment of the underlying cause or condition.  Physical therapy.  This involves performing exercises to get the shoulder moving again.  Medicine. Medicine may be given to relieve pain, inflammation, or muscle spasms.  Steroid injections into the shoulder joint.  Shoulder manipulation. This is a procedure to move the shoulder into another position. It is done after you are given a medicine to make you fall asleep (general anesthetic). The joint may also be injected with salt water at high pressure to break down scarring.  Surgery. This may be done in severe cases when other treatments have failed.  Although most people recover completely from adhesive capsulitis, some may not regain the full movement of the shoulder. Follow these instructions at home:  Take over-the-counter and prescription medicines only as told by your health care provider.  If you are being treated with physical therapy, follow instructions from your physical therapist.  Avoid exercises that put a lot of demand on your shoulder, such as throwing. These exercises can make pain worse.  If directed, apply ice to the injured area: ? Put ice in a plastic bag. ? Place a towel between your skin and the bag. ? Leave the ice on for 20 minutes, 2-3 times per day. Contact a health care provider if:  You develop new symptoms.  Your symptoms get worse. This information is not intended to replace advice given to you by your health care provider. Make sure you discuss any questions you have with your health care provider. Document Released: 07/26/2009 Document Revised: 03/05/2016 Document Reviewed: 01/21/2015 Elsevier Interactive Patient Education  [employer].

## 2018-05-30 LAB — COMPLETE METABOLIC PANEL WITH GFR
AG RATIO: 1.9 (calc) (ref 1.0–2.5)
ALKALINE PHOSPHATASE (APISO): 60 U/L (ref 40–115)
ALT: 18 U/L (ref 9–46)
AST: 12 U/L (ref 10–35)
Albumin: 4.2 g/dL (ref 3.6–5.1)
BILIRUBIN TOTAL: 0.7 mg/dL (ref 0.2–1.2)
BUN: 11 mg/dL (ref 7–25)
CHLORIDE: 105 mmol/L (ref 98–110)
CO2: 25 mmol/L (ref 20–32)
Calcium: 9.3 mg/dL (ref 8.6–10.3)
Creat: 1.16 mg/dL (ref 0.70–1.25)
GFR, Est African American: 78 mL/min/{1.73_m2} (ref >=60)
GFR, Est Non African American: 68 mL/min/{1.73_m2} (ref >=60)
GLUCOSE: 94 mg/dL (ref 65–99)
Globulin: 2.2 g/dL (calc) (ref 1.9–3.7)
POTASSIUM: 4.2 mmol/L (ref 3.5–5.3)
Sodium: 137 mmol/L (ref 135–146)
Total Protein: 6.4 g/dL (ref 6.1–8.1)

## 2018-05-30 LAB — LIPID PANEL W/REFLEX DIRECT LDL
Cholesterol: 125 mg/dL (ref <200)
HDL: 39 mg/dL — ABNORMAL LOW (ref >40)
LDL CHOLESTEROL (CALC): 71 mg/dL
Non-HDL Cholesterol (Calc): 86 mg/dL (calc) (ref <130)
Total CHOL/HDL Ratio: 3.2 (calc) (ref <5.0)
Triglycerides: 72 mg/dL (ref <150)

## 2018-05-30 LAB — CBC WITH DIFFERENTIAL/PLATELET
BASOS PCT: 1.4 %
Basophils Absolute: 123 cells/uL (ref 0–200)
EOS ABS: 150 {cells}/uL (ref 15–500)
Eosinophils Relative: 1.7 %
HCT: 45.4 % (ref 38.5–50.0)
HEMOGLOBIN: 15.5 g/dL (ref 13.2–17.1)
Lymphs Abs: 2596 cells/uL (ref 850–3900)
MCH: 31 pg (ref 27.0–33.0)
MCHC: 34.1 g/dL (ref 32.0–36.0)
MCV: 90.8 fL (ref 80.0–100.0)
MPV: 10.9 fL (ref 7.5–12.5)
Monocytes Relative: 7.9 %
Neutro Abs: 5236 cells/uL (ref 1500–7800)
Neutrophils Relative %: 59.5 %
Platelets: 246 10*3/uL (ref 140–400)
RBC: 5 10*6/uL (ref 4.20–5.80)
RDW: 12.5 % (ref 11.0–15.0)
TOTAL LYMPHOCYTE: 29.5 %
WBC: 8.8 10*3/uL (ref 3.8–10.8)
WBCMIX: 695 {cells}/uL (ref 200–950)

## 2018-05-30 LAB — PSA, TOTAL AND FREE
PSA, % Free: 11 % (calc) — ABNORMAL LOW (ref >25)
PSA, Free: 0.2 ng/mL
PSA, TOTAL: 1.8 ng/mL (ref <=4.0)

## 2018-11-23 ENCOUNTER — Ambulatory Visit: Payer: BLUE CROSS/BLUE SHIELD | Admitting: Physician Assistant

## 2018-11-28 ENCOUNTER — Encounter: Payer: Self-pay | Admitting: Physician Assistant

## 2018-11-28 ENCOUNTER — Ambulatory Visit (INDEPENDENT_AMBULATORY_CARE_PROVIDER_SITE_OTHER): Payer: BLUE CROSS/BLUE SHIELD | Admitting: Physician Assistant

## 2018-11-28 VITALS — BP 118/86 | HR 78 | Ht 71.0 in | Wt 178.0 lb

## 2018-11-28 DIAGNOSIS — I10 Essential (primary) hypertension: Secondary | ICD-10-CM

## 2018-11-28 DIAGNOSIS — M19012 Primary osteoarthritis, left shoulder: Secondary | ICD-10-CM

## 2018-11-28 DIAGNOSIS — M545 Low back pain, unspecified: Secondary | ICD-10-CM

## 2018-11-28 DIAGNOSIS — K219 Gastro-esophageal reflux disease without esophagitis: Secondary | ICD-10-CM

## 2018-11-28 DIAGNOSIS — F411 Generalized anxiety disorder: Secondary | ICD-10-CM

## 2018-11-28 DIAGNOSIS — N5231 Erectile dysfunction following radical prostatectomy: Secondary | ICD-10-CM

## 2018-11-28 DIAGNOSIS — J3089 Other allergic rhinitis: Secondary | ICD-10-CM

## 2018-11-28 DIAGNOSIS — C61 Malignant neoplasm of prostate: Secondary | ICD-10-CM

## 2018-11-28 DIAGNOSIS — N529 Male erectile dysfunction, unspecified: Secondary | ICD-10-CM

## 2018-11-28 DIAGNOSIS — R350 Frequency of micturition: Secondary | ICD-10-CM

## 2018-11-28 DIAGNOSIS — G8929 Other chronic pain: Secondary | ICD-10-CM

## 2018-11-28 DIAGNOSIS — Z79899 Other long term (current) drug therapy: Secondary | ICD-10-CM

## 2018-11-28 DIAGNOSIS — N401 Enlarged prostate with lower urinary tract symptoms: Secondary | ICD-10-CM

## 2018-11-28 MED ORDER — AMLODIPINE BESYLATE 10 MG PO TABS: ORAL_TABLET | ORAL | 3 refills | Status: DC

## 2018-11-28 MED ORDER — ESOMEPRAZOLE MAGNESIUM 40 MG PO CPDR: DELAYED_RELEASE_CAPSULE | ORAL | 3 refills | Status: DC

## 2018-11-28 MED ORDER — DIAZEPAM 2 MG PO TABS: 2.0000 mg | ORAL_TABLET | Freq: Every evening | ORAL | 2 refills | Status: DC | PRN

## 2018-11-28 MED ORDER — FLUTICASONE PROPIONATE 50 MCG/ACT NA SUSP: 1.0000 | Freq: Every day | NASAL | 11 refills | Status: DC

## 2018-11-28 MED ORDER — TADALAFIL 20 MG PO TABS: 20.0000 mg | ORAL_TABLET | Freq: Every day | ORAL | 5 refills | Status: DC | PRN

## 2018-11-28 MED ORDER — MELOXICAM 15 MG PO TABS: 15.0000 mg | ORAL_TABLET | Freq: Every day | ORAL | 3 refills | Status: DC

## 2018-11-28 NOTE — Progress Notes (Signed)
Subjective:    Patient ID: Jack Grimes, male    DOB: Jan 30, 1956, 63 y.o.   MRN: [id number]  HPI  Patient is a 63 year old male with hypertension, BPH, prostate cancer who presents to the clinic for his 71-month follow-up.  Hypertension-placed and is taking his medications daily.  He denies any chest pain, palpitations, headache or vision changes.  He does admit to just taking his medication for walking in the door today.  He does wish to get labs.  He has prostate cancer and following up yearly. He has been in remission since 2006 where he had external beam radiation without chemotherapy. He has had some increase in urine frequency, urgency, weak stream.  He admits he has not seen urology in the last 2 years. Last note I see from urology is 2017.   Overall his anxiety is fairly well controlled on medication.  No suicidal thoughts or homicidal idealizations.  GERD is controlled on omeprazole.  Patient does have continued intermittent left shoulder pain.  Last visit he did have an x-ray that showed significant arthritis.  He really does not want to move forward with an injection.  Meloxicam helps very well.  He tries not to take it daily.  He does worry about taking it daily and his kidney function.  .. Active Ambulatory Problems    Diagnosis Date Noted  . NEOPLASM, MALIGNANT, PROSTATE 10/27/2007  . ADJUSTMENT DISORDER WITH ANXIETY 01/21/2009  . VISUAL CHANGES 09/16/2009  . HYPERTENSION, BENIGN ESSENTIAL 06/09/2007  . GERD 10/25/2007  . ALLERGIC URTICARIA 01/21/2009  . BACK PAIN, LUMBAR 10/25/2007  . LYMPHOMA, HX OF 11/14/2007  . GAD (generalized anxiety disorder) 10/24/2012  . 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 Erectile dysfunction 01/23/2013  . BPH (benign prostatic hyperplasia) 01/23/2013  . Right lateral epicondylitis 02/10/2013  . Right knee pain 09/25/2013  . Erectile dysfunction of non-organic origin 05/11/2014  . Multiple joint pain 10/31/2014  . Subconjunctival  hemorrhage of right eye 04/25/2015  . Carpal tunnel syndrome, left 04/25/2015  . Left shoulder pain 01/01/2016  . Strong pulse 08/14/2016  . 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/29/2017   Resolved Ambulatory Problems    Diagnosis Date Noted  . No Resolved Ambulatory Problems   Past Medical History:  Diagnosis Date  . Cancer ([place])   . GERD (gastroesophageal reflux disease)   . Hypertension      Review of Systems See HPI>     Objective:   Physical Exam        Assessment & Plan:  .Marland KitchenJohn was seen today for follow-up.  Diagnoses and all orders for this visit:  HYPERTENSION, BENIGN ESSENTIAL -     amLODipine (NORVASC) 10 MG tablet; Take one tablet by mouth daily  GAD (generalized anxiety disorder) -     diazepam (VALIUM) 2 MG tablet; Take 1 tablet (2 mg total) by mouth at bedtime as needed.  Gastroesophageal reflux disease, esophagitis presence not specified -     esomeprazole (NEXIUM) 40 MG capsule; Take one capsule by mouth daily at 12 noon  Chronic bilateral low back pain without sciatica -     meloxicam (MOBIC) 15 MG tablet; Take 1 tablet (15 mg total) by mouth daily.  Non-seasonal allergic rhinitis, unspecified trigger -     fluticasone (FLONASE) 50 MCG/ACT nasal spray; Place 1 spray into both nostrils daily.  Medication management -     BASIC METABOLIC PANEL WITH GFR -     PSA  NEOPLASM, MALIGNANT, PROSTATE -  tadalafil (ADCIRCA/CIALIS) 20 MG tablet; Take 1 tablet (20 mg total) by mouth daily as needed for erectile dysfunction. -     PSA  Localized osteoarthritis of left shoulder -     fluticasone (FLONASE) 50 MCG/ACT nasal spray; Place 1 spray into both nostrils daily.  Erectile dysfunction after radical prostatectomy  Benign prostatic hyperplasia with urinary frequency -     tadalafil (ADCIRCA/CIALIS) 20 MG tablet; Take 1 tablet (20 mg total) by mouth daily as needed for erectile dysfunction.   Will get labs today. Lipid looks great. No  need to recheck.   We will check PSA and labs today.  Strongly encourage patient to follow-up with urology to monitor his prostate cancer remission.  Discussed left shoulder pain.  I think he would greatly benefit from an injection.  He is adamantly opposed to injections.  He will continue to do the exercises given and use Mobic intermittently.  Encouraged ice as needed. Mentioned getting an ortho specialist involved for injections. He will let me know if he wants this. cmp check kidney function.   Valium for as needed usage for anxiety.   Follow up in 6 months.

## 2018-11-29 ENCOUNTER — Telehealth: Payer: Self-pay

## 2018-11-29 ENCOUNTER — Other Ambulatory Visit: Payer: Self-pay | Admitting: Physician Assistant

## 2018-11-29 ENCOUNTER — Encounter: Payer: Self-pay | Admitting: Physician Assistant

## 2018-11-29 LAB — BASIC METABOLIC PANEL WITH GFR
BUN: 14 mg/dL (ref 7–25)
CHLORIDE: 105 mmol/L (ref 98–110)
CO2: 26 mmol/L (ref 20–32)
Calcium: 9.8 mg/dL (ref 8.6–10.3)
Creat: 0.9 mg/dL (ref 0.70–1.25)
GFR, Est African American: 106 mL/min/{1.73_m2} (ref >=60)
GFR, Est Non African American: 92 mL/min/{1.73_m2} (ref >=60)
GLUCOSE: 100 mg/dL — AB (ref 65–99)
Potassium: 4.4 mmol/L (ref 3.5–5.3)
SODIUM: 139 mmol/L (ref 135–146)

## 2018-11-29 LAB — PSA: PSA: 9 ng/mL — AB (ref <=4.0)

## 2018-11-29 MED ORDER — SILDENAFIL CITRATE 20 MG PO TABS: ORAL_TABLET | ORAL | 3 refills | Status: DC

## 2018-11-29 NOTE — Progress Notes (Signed)
PSA increased a lot from one year ago. Need to see urology as soon as possible.

## 2018-11-29 NOTE — Progress Notes (Signed)
Labs look great. Kidney function great. PSA pending....will call when results.

## 2018-11-29 NOTE — Telephone Encounter (Signed)
Sent sildenafil.

## 2018-11-29 NOTE — Telephone Encounter (Signed)
Pt states that the Cialis RX is very expensive at his pharmacy. States that if he can have a different RX sent for Sildenafil for #90 it would be much cheaper.   Please review and send RX if appropriate.

## 2018-11-29 NOTE — Telephone Encounter (Signed)
Pt advised.

## 2018-12-14 ENCOUNTER — Telehealth: Payer: Self-pay | Admitting: Oncology

## 2018-12-14 NOTE — Telephone Encounter (Signed)
Per sch msg, patient needs to reschedule appt that was missed in January. Spoke to patient. Scheduled and confirmed date and time with patient.

## 2018-12-30 ENCOUNTER — Telehealth: Payer: Self-pay | Admitting: Oncology

## 2018-12-30 NOTE — Telephone Encounter (Signed)
Returned call to patient re rescheduling 4/8 f/u to 3/25. Gave patient f/u for 3/25.

## 2019-01-04 ENCOUNTER — Ambulatory Visit: Payer: BLUE CROSS/BLUE SHIELD | Admitting: Oncology

## 2019-01-18 ENCOUNTER — Ambulatory Visit: Payer: BLUE CROSS/BLUE SHIELD | Admitting: Oncology

## 2019-01-26 ENCOUNTER — Telehealth: Payer: Self-pay | Admitting: Oncology

## 2019-01-26 NOTE — Telephone Encounter (Signed)
Rescheduled 5/7 appt per sch msg. Called patient. No answer. Left msg. Mailed printout

## 2019-02-16 ENCOUNTER — Ambulatory Visit: Payer: BLUE CROSS/BLUE SHIELD | Admitting: Oncology

## 2019-02-20 ENCOUNTER — Encounter: Payer: Self-pay | Admitting: Physician Assistant

## 2019-02-20 ENCOUNTER — Other Ambulatory Visit: Payer: Self-pay

## 2019-02-20 ENCOUNTER — Telehealth: Payer: Self-pay | Admitting: Physician Assistant

## 2019-02-20 ENCOUNTER — Ambulatory Visit (INDEPENDENT_AMBULATORY_CARE_PROVIDER_SITE_OTHER): Payer: BLUE CROSS/BLUE SHIELD | Admitting: Physician Assistant

## 2019-02-20 VITALS — BP 92/88 | Ht 71.0 in | Wt 178.0 lb

## 2019-02-20 DIAGNOSIS — N401 Enlarged prostate with lower urinary tract symptoms: Secondary | ICD-10-CM

## 2019-02-20 DIAGNOSIS — R197 Diarrhea, unspecified: Secondary | ICD-10-CM

## 2019-02-20 DIAGNOSIS — R110 Nausea: Secondary | ICD-10-CM | POA: Diagnosis not present

## 2019-02-20 DIAGNOSIS — N3943 Post-void dribbling: Secondary | ICD-10-CM

## 2019-02-20 DIAGNOSIS — R6883 Chills (without fever): Secondary | ICD-10-CM

## 2019-02-20 DIAGNOSIS — C61 Malignant neoplasm of prostate: Secondary | ICD-10-CM

## 2019-02-20 MED ORDER — ONDANSETRON 8 MG PO TBDP: 8.0000 mg | ORAL_TABLET | Freq: Three times a day (TID) | ORAL | 0 refills | Status: DC | PRN

## 2019-02-20 NOTE — Telephone Encounter (Signed)
Thank you so much

## 2019-02-20 NOTE — Telephone Encounter (Signed)
Pt advised of mobile COVID-19 unit that [facility] offers. Advised of the multiple sites that will be testing tomorrow. Per [facility] website - no order needed.

## 2019-02-20 NOTE — Progress Notes (Deleted)
Since Friday/Saturday started feeling bad. States he "had no power" Started with headache, but that is gone.  Having diarrhea and some nausea, no vomiting. He feels winded, but not SOB. He had chills, woke up drenched, but does not have a thermometer so didn't take his temperature. He was out of town in [place].

## 2019-02-20 NOTE — Progress Notes (Signed)
Patient ID: Jack Grimes, male   DOB: 07-11-1956, 63 y.o.   MRN: [id number] .Virtual Visit via Telephone Note  I connected with Deberah Pelton on 02/21/19 at  3:20 PM EDT by telephone and verified that I am speaking with the correct person using two identifiers.  Location: Patient: home Provider: clinic   I discussed the limitations, risks, security and privacy concerns of performing an evaluation and management service by telephone and the availability of in person appointments. I also discussed with the patient that there may be a patient responsible charge related to this service. The patient expressed understanding and agreed to proceed.   History of Present Illness: Pt is a 63 yo male who calls into the clinic with diarrhea, nausea, headache since Friday night/saturday morning, 3 days ago. 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 His headache has resolved. No checked fever but having chills and sweats. No vomiting but very nauseated. Having multiple loose stools a day. Denies any melena or hematochezia. Denies any reflux symptoms. No abdominal pain but some cramping. He lives in [place] and wonders about COVID testing. He denies any cough but feels "winded". No wheezing. Not taking anything OTC.   Recent elevation of PSA. Has appt with urologist, Dr. Alinda Money. He does feel like is urinary symptoms are worsening.   .. Active Ambulatory Problems    Diagnosis Date Noted  . NEOPLASM, MALIGNANT, PROSTATE 10/27/2007  . ADJUSTMENT DISORDER WITH ANXIETY 01/21/2009  . VISUAL CHANGES 09/16/2009  . HYPERTENSION, BENIGN ESSENTIAL 06/09/2007  . GERD 10/25/2007  . ALLERGIC URTICARIA 01/21/2009  . BACK PAIN, LUMBAR 10/25/2007  . LYMPHOMA, HX OF 11/14/2007  . GAD (generalized anxiety disorder) 10/24/2012  . 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 Erectile dysfunction 01/23/2013  . BPH (benign prostatic hyperplasia) 01/23/2013  . Right lateral epicondylitis 02/10/2013  . Right knee  pain 09/25/2013  . Erectile dysfunction of non-organic origin 05/11/2014  . Multiple joint pain 10/31/2014  . Subconjunctival hemorrhage of right eye 04/25/2015  . Carpal tunnel syndrome, left 04/25/2015  . Left shoulder pain 01/01/2016  . Strong pulse 08/14/2016  . 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/29/2017   Resolved Ambulatory Problems    Diagnosis Date Noted  . No Resolved Ambulatory Problems   Past Medical History:  Diagnosis Date  . Cancer ([place])   . GERD (gastroesophageal reflux disease)   . Hypertension    Reviewed med, allergy, problem list.     Observations/Objective: No acute distress.  Pt worried and concerned.  Normal breathing.   .. Today's Vitals   02/20/19 1448  BP: 92/88  Weight: 178 lb (80.7 kg)  Height: 5\' 11"  (1.803 m)   Body mass index is 24.83 kg/m.    Assessment and Plan: .Marland KitchenJohn was seen today for headache.  Diagnoses and all orders for this visit:  Diarrhea, unspecified type  Nausea -     ondansetron (ZOFRAN-ODT) 8 MG disintegrating tablet; Take 1 tablet (8 mg total) by mouth every 8 (eight) hours as needed for nausea.  Chills  Benign prostatic hyperplasia with post-void dribbling  NEOPLASM, MALIGNANT, PROSTATE  likely symptoms represent some viral etiology. Pt is concerned about COVID. Certainly meets some of the requirements. Ok to go to testing center. Will write out of work for suspected covid. Discussed BRAT diet. zofran for nausea. Follow up as needed or if symptoms worsen or change.   Pt has appt with urologist. He was being monitored for prostate cancer. He missed several follow  ups. psa elevated more. Not on any medications. Will call and see if he would like to be put on something to help with urinary symptoms while waiting for urologist follow up.    Follow Up Instructions:    I discussed the assessment and treatment plan with the patient. The patient was provided an opportunity to ask questions and all were  answered. The patient agreed with the plan and demonstrated an understanding of the instructions.   The patient was advised to call back or seek an in-person evaluation if the symptoms worsen or if the condition fails to improve as anticipated.  I provided 25 minutes of non-face-to-face time during this encounter.   Iran Planas, PA-C

## 2019-02-20 NOTE — Telephone Encounter (Signed)
Pt lives in [place] and has covid symptoms. Would like testing. [facility] patient say is doing testing. Can we find out how to get order slip and what requirements are?

## 2019-02-21 MED ORDER — TERAZOSIN HCL 1 MG PO CAPS: 1.0000 mg | ORAL_CAPSULE | Freq: Every day | ORAL | 3 refills | Status: DC

## 2019-02-21 NOTE — Telephone Encounter (Signed)
Left message on machine for patient to call back.

## 2019-02-21 NOTE — Telephone Encounter (Signed)
Patient called back.  He hasn't picked up Zofran yet, but feeling a little better this morning.   He would like to have something sent in for the prostate. He has used Flomax before, but can't remember if it was helpful. He is willing to start anything you would like to send for him.   He is on his way now to the testing site.

## 2019-02-21 NOTE — Telephone Encounter (Signed)
Testing site is open from 3p-7p today so it will be later before he is tested

## 2019-02-21 NOTE — Telephone Encounter (Signed)
First he is supposed to get covid tested today. kelsi gave him information on where to go. See how he feels today. Did get get zofran. Did it help?  Secondly, I am not sure why he is not on anything for his prostate. He reported worsening symptoms. Would he like to try something before appt with urology?

## 2019-02-21 NOTE — Telephone Encounter (Signed)
Ok I sent a tablet to take at bedtime.

## 2019-02-23 ENCOUNTER — Telehealth: Payer: Self-pay | Admitting: Neurology

## 2019-02-23 DIAGNOSIS — R8290 Unspecified abnormal findings in urine: Secondary | ICD-10-CM

## 2019-02-23 NOTE — Telephone Encounter (Signed)
It is certainly possible. Does he want to drop off urine dipstick and culture?

## 2019-02-23 NOTE — Telephone Encounter (Signed)
Left message on machine for patient letting him know he can go directly to the lab on the first floor of our building to leave his specimen. He is to call back with any questions.

## 2019-02-23 NOTE — Telephone Encounter (Signed)
Lab orders placed for Pt to visit lab to drop off specimen

## 2019-02-23 NOTE — Telephone Encounter (Signed)
Patient called back.  He states he had started feeling better, was able to keep some food down last night. He took Zofran and Ibuprofen at bedtime and had "crazy dreams" he states he doesn't want to take it anymore. His nausea has subsided, and I advised he could stay off of it, but medication has no narcotic properties so unsure why it affected him that way.   He also developed worsening headache in the night and had sweats. He is worried about possible UTI as he is taking in lots of water, Gatorade, and Pedialyte and his output is hardly anything. He also states he did have some burning when he urinated on Friday and he has an odor to his urine that started last week. He is wondering if all these symptoms could be a UTI.   Please advise.

## 2019-02-23 NOTE — Telephone Encounter (Addendum)
Spoke with patient. He does want to come in and leave urine specimen to be tested.

## 2019-02-24 ENCOUNTER — Other Ambulatory Visit: Payer: Self-pay | Admitting: Physician Assistant

## 2019-02-24 MED ORDER — CIPROFLOXACIN HCL 500 MG PO TABS: 500.0000 mg | ORAL_TABLET | Freq: Two times a day (BID) | ORAL | 0 refills | Status: DC

## 2019-02-24 NOTE — Telephone Encounter (Signed)
You do have a confirmed urinary tract infection. I will send over cipro antibiotic to treat.

## 2019-02-25 LAB — URINALYSIS, ROUTINE W REFLEX MICROSCOPIC
Bilirubin Urine: NEGATIVE
Glucose, UA: NEGATIVE
Hgb urine dipstick: NEGATIVE
Hyaline Cast: NONE SEEN /LPF
Ketones, ur: NEGATIVE
Nitrite: POSITIVE — AB
Protein, ur: NEGATIVE
Specific Gravity, Urine: 1.013 (ref 1.001–1.03)
Squamous Epithelial / HPF: NONE SEEN /HPF (ref <=5)
WBC, UA: >=60 /HPF — AB (ref 0–5)
pH: <=5 (ref 5.0–8.0)

## 2019-02-25 LAB — URINE CULTURE
MICRO NUMBER:: 475122
SPECIMEN QUALITY:: ADEQUATE

## 2019-02-27 ENCOUNTER — Encounter: Payer: Self-pay | Admitting: Neurology

## 2019-02-27 ENCOUNTER — Telehealth: Payer: Self-pay | Admitting: Physician Assistant

## 2019-02-27 MED ORDER — AMOXICILLIN-POT CLAVULANATE 500-125 MG PO TABS: 1.0000 | ORAL_TABLET | Freq: Two times a day (BID) | ORAL | 0 refills | Status: DC

## 2019-02-27 NOTE — Telephone Encounter (Signed)
E.coli bacteria in urine. How do you feel today?

## 2019-02-27 NOTE — Telephone Encounter (Signed)
Hives with cipro changed based on culture.

## 2019-02-27 NOTE — Telephone Encounter (Signed)
I don't want you to take abx that is causing reaction. Sent augmentin based on culture. Added cipro to allergy list.

## 2019-03-07 ENCOUNTER — Other Ambulatory Visit: Payer: Self-pay | Admitting: Urology

## 2019-03-07 DIAGNOSIS — C61 Malignant neoplasm of prostate: Secondary | ICD-10-CM

## 2019-03-07 DIAGNOSIS — S0540XA Penetrating wound of orbit with or without foreign body, unspecified eye, initial encounter: Secondary | ICD-10-CM

## 2019-03-29 ENCOUNTER — Other Ambulatory Visit: Payer: Self-pay

## 2019-03-29 ENCOUNTER — Ambulatory Visit
Admission: RE | Admit: 2019-03-29 | Discharge: 2019-03-29 | Disposition: A | Discharge status: Home / Self Care | Payer: BLUE CROSS/BLUE SHIELD | Source: Ambulatory Visit | Attending: Urology | Admitting: Urology

## 2019-03-29 ENCOUNTER — Other Ambulatory Visit: Payer: BLUE CROSS/BLUE SHIELD

## 2019-03-29 DIAGNOSIS — S0540XA Penetrating wound of orbit with or without foreign body, unspecified eye, initial encounter: Secondary | ICD-10-CM

## 2019-03-29 DIAGNOSIS — C61 Malignant neoplasm of prostate: Secondary | ICD-10-CM

## 2019-03-29 MED ORDER — GADOBENATE DIMEGLUMINE 529 MG/ML IV SOLN
17.0000 mL | Freq: Once | INTRAVENOUS | Status: AC | PRN
  Administered 2019-03-29: 17 mL via INTRAVENOUS

## 2019-05-31 ENCOUNTER — Ambulatory Visit: Payer: BLUE CROSS/BLUE SHIELD | Admitting: Physician Assistant

## 2019-06-20 ENCOUNTER — Ambulatory Visit: Payer: BC Managed Care – PPO

## 2019-06-20 ENCOUNTER — Ambulatory Visit
Admission: RE | Admit: 2019-06-20 | Discharge: 2019-06-20 | Disposition: A | Discharge status: Home / Self Care | Payer: BC Managed Care – PPO | Source: Ambulatory Visit | Attending: Radiation Oncology | Admitting: Radiation Oncology

## 2019-06-20 ENCOUNTER — Encounter: Payer: Self-pay | Admitting: Radiation Oncology

## 2019-06-20 ENCOUNTER — Other Ambulatory Visit: Payer: Self-pay

## 2019-06-20 NOTE — Progress Notes (Addendum)
GU Location of Tumor / Histology: prostatic adenocarcinoma  If Prostate Cancer, Gleason Score is (4 + 3) and PSA is (11.50). Prostate volume: 82.1 cc.   Jack Grimes diagnosed with prostate ca in 2003 at the age of 54 but opted for active surveillance while in [place]. Patient moved to [place] in 2009. Patient did not undergo another prostate biopsy until October 2012 when he established care with Dr. Alinda Money.  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   Possible current pregnancy?  No, male patient  Is the patient on methotrexate?   Current Complaints / other details:  63 year old male. Father with history of prostate ca. Married with one step daughter.  Curative treatment strongly recommended with either radiotherapy or surgery.  /05/2019 1330 Phoned patient to begin consult process as scheduled. No answer. Left voicemail message with my direct contact number. Patient phoned back at [phone]. Patient reports he is working. Patient states, "I have to drive to [facility] everyday for work." Patient goes onto explain he would pull over so he could concentrate on the consult but he is already four hours behind. Stressed the importance of definitive therapy in his case. Patient questions if Dr. Tammi Klippel could consult with him on a Saturday or Sunday. Explained Dr. Tammi Klippel consults patients on Tuesday and Friday. Patient states, "a Friday might work." Patient suggest "maybe a 0730 consult so he can be to work by 0800." Made several suggestions to remedy appointments conflicting with work and even offered to fax/email/send employer a  doctors note. Ultimately, patient unavailable for consult today and still want this RN to inquire if Dr. Tammi Klippel is available on a Saturday or Sunday. This RN committed to informing Dr. Tammi Klippel of the patient's suggestions and calling him back with another appointment.

## 2019-06-27 ENCOUNTER — Ambulatory Visit
Admission: RE | Admit: 2019-06-27 | Discharge: 2019-06-27 | Disposition: A | Discharge status: Home / Self Care | Payer: BC Managed Care – PPO | Source: Ambulatory Visit | Attending: Radiation Oncology | Admitting: Radiation Oncology

## 2019-06-27 ENCOUNTER — Encounter: Payer: Self-pay | Admitting: Radiation Oncology

## 2019-06-27 ENCOUNTER — Other Ambulatory Visit: Payer: Self-pay

## 2019-06-27 VITALS — Ht 71.0 in | Wt 184.0 lb

## 2019-06-27 DIAGNOSIS — C61 Malignant neoplasm of prostate: Secondary | ICD-10-CM

## 2019-06-27 HISTORY — DX: Malignant neoplasm of prostate: C61

## 2019-06-27 NOTE — Progress Notes (Signed)
GU Location of Tumor / Histology: prostatic adenocarcinoma  If Prostate Cancer, Gleason Score is (4 + 3) and PSA is (11.50). Prostate volume: 82.1 cc.   Deberah Pelton diagnosed with prostate ca in 2003 at the age of 64 but opted for active surveillance while in [place]. Patient moved to [place] in 2009. Patient did not undergo another prostate biopsy until October 2012 when he established care with Dr. Alinda Money.  Biopsies of prostate (if applicable) revealed:   Past/Anticipated interventions by urology, if any: prostate biopsy, patient non compliant with follow up  Past/Anticipated interventions by medical oncology, if any: none  Weight changes, if any: denies  Bowel/Bladder complaints, if any: IPSS 6. SHIM 24 with medication. Denies dysuria, hematuria, urinary leakage or incontinence. Denies bowel complaints.     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 denies  Possible current pregnancy?  No, male patient  Is the patient on methotrexate? denies  Current Complaints / other details:  63 year old male. Father with history of prostate ca. Married with one step daughter.  Curative treatment strongly recommended with either radiotherapy or surgery.  /05/2019 1330 Phoned patient to begin consult process as scheduled. No answer. Left voicemail message with my direct contact number. Patient phoned back at [phone]. Patient reports he is working. Patient states, "I have to drive to [facility] everyday for work." Patient goes onto explain he would pull over so he could concentrate on the consult but he is already four hours behind. Stressed the importance of definitive therapy in his case. Patient questions if Dr. Tammi Klippel could consult with him on a Saturday or Sunday. Explained Dr. Tammi Klippel consults patients on Tuesday and Friday. Patient states, "a Friday might work." Patient  suggest "maybe a 0730 consult so he can be to work by 0800." Made several suggestions to remedy appointments conflicting with work and even offered to fax/email/send employer a doctors note. Ultimately, patient unavailable for consult today and still want this RN to inquire if Dr. Tammi Klippel is available on a Saturday or Sunday. This RN committed to informing Dr. Tammi Klippel of the patient's suggestions and calling him back with another appointment.

## 2019-06-27 NOTE — Progress Notes (Signed)
Radiation Oncology         [phone] ________________________________  Initial outpatient Consultation - Conducted via Telephone due to current COVID-19 concerns for limiting patient exposure  Name: Jack Grimes MRN: [id number]  Date: 06/27/2019  DOB: [birth year]  [id number], Duke Salvia, MD   REFERRING PHYSICIAN: Raynelle Bring, MD  DIAGNOSIS: 63 y.o. gentleman with Stage T1c adenocarcinoma of the prostate with Gleason score of 4+3, and PSA of 11.5.    ICD-10-CM   1. Malignant neoplasm of prostate ([place])  C61     HISTORY OF PRESENT ILLNESS: Jack Grimes is a 63 y.o. male with a diagnosis of prostate cancer. He was initially diagnosed with Gleason 6 prostate cancer with Dr. Caryl Comes at the [facility] in 08/2002 while living in [place]. At that time, he opted for active surveillance. He moved to [facility] and established care initially with Dr. Laurence Ferrari in 11/2007 but failed to follow-up thereafter.  He later established care with Dr. Alinda Money in 07/2011, digital rectal examination was performed at that time was normal. A repeat biopsy was performed at that time, confirming stable, Gleason 6 disease.  A second surveillance biopsy was performed in 10/2013 and again confirmed stable, Gleason 6 disease. He was lost to follow up after his last visit in 10/2015, after he declined a recommended repeat surveillance biopsy. PSA at that time was 3.32.  He presented back to Dr. Alinda Money on 03/03/2019 after being told his PSA was up to 9 in 11/2018, with DRE at that time again normal. PSA on 03/05/2019 was further increased at 11.50. He underwent a surveillance prostate MRI on 03/29/2019, which showed: generalized ill-defined low T2 signal in the prostate gland peripheral zone bilaterally with three suspicious lesions identified.  There were two PI-RADS 4 lesions and one PI-RADS 3 lesion but no overtly pathologically enlarged adenopathy identified.  The prostate volume was estimated to be 69.38 cc. He  proceeded to MRI fusion biopy on 04/25/2019.  The prostate volume measured 82.1 cc.  Out of a total of 24 core biopsies, 6 were positive.  The maximum Gleason score was 4+3, and this was seen in left base.  Additionally, Gleason 3+4 was seen in left base lateral and Gleason 3+3 was seen in right apex, right apex lateral, and two cores from the right ROI lesion #1.  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  He has been in remission since that time and is currently followed with Dr. Alen Blew, although his last documented follow-up visit was in 2017.  The patient reviewed the biopsy results with his urologist and he has kindly been referred today for discussion of potential radiation treatment options.   PREVIOUS RADIATION THERAPY: Yes 2006: Left Axilla for low grade follicular lymphoma, grade 2, stage IE involvement of the skin (in [place], [place])  PAST MEDICAL HISTORY:  Past Medical History:  Diagnosis Date   BPH (benign prostatic hyperplasia)    urology [facility])    low grade follicular lymphoma grade 2, stage 1E, S/P external beam radiation, prostate CA    GERD (gastroesophageal reflux disease)    Hypertension    Prostate cancer ([place])       PAST SURGICAL HISTORY: Past Surgical History:  Procedure Laterality Date   axillary lymphoma     Left   PROSTATE BIOPSY     TONSILLECTOMY      FAMILY HISTORY:  Family History  Problem Relation Age of Onset   Prostate cancer Father 28   Breast cancer Neg Hx    Colon cancer Neg Hx     SOCIAL HISTORY:  Social History   Socioeconomic History   Marital status: Married    Spouse name: Not on file   Number of children: 1   Years of education: Not on file   Highest education level: Not on file  Occupational History   Not on file  Social Needs   Financial resource strain: Not on file   Food insecurity     Worry: Not on file    Inability: Not on file   Transportation needs    Medical: Not on file    Non-medical: Not on file  Tobacco Use   Smoking status: Former Smoker    Packs/day: 1.00    Years: 20.00    Pack years: 20.00    Types: Cigarettes    Quit date: 10/12/2002    Years since quitting: 16.7   Smokeless tobacco: Never Used  Substance and Sexual Activity   Alcohol use: Yes    Alcohol/week: 4.0 standard drinks    Types: 4 Cans of beer per week    Comment: per week   Drug use: Never   Sexual activity: Yes    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   Lifestyle   Physical activity    Days per week: Not on file    Minutes per session: Not on file   Stress: Not on file  Relationships   Social connections    Talks on phone: Not on file    Gets together: Not on file    Attends religious service: Not on file    Active member of club or organization: Not on file    Attends meetings of clubs or organizations: Not on file    Relationship status: Not on file   Intimate partner violence    Fear of current or ex partner: Not on file    Emotionally abused: Not on file    Physically abused: Not on file    Forced sexual activity: Not on file  Other Topics Concern   Not on file  Social History Narrative   Not on file    ALLERGIES: Lisinopril-hydrochlorothiazide, Sulfonamide derivatives, and Ciprofloxacin  MEDICATIONS:  Current Outpatient Medications  Medication Sig Dispense Refill   Amino Acids (DAILY AMINO 6000 PO) Take 40 mg by mouth daily.     amLODipine (NORVASC) 10 MG tablet Take one tablet by mouth daily 90 tablet 3   aspirin 81 MG tablet Take 81 mg by mouth daily.       diazepam (VALIUM) 2 MG tablet Take 1 tablet (2 mg total) by mouth at bedtime as needed. 30 tablet 2   esomeprazole (NEXIUM) 40 MG capsule Take one capsule by mouth daily at 12 noon 90 capsule 3   fluticasone (FLONASE) 50 MCG/ACT nasal spray Place 1 spray into both nostrils  daily. 16 g 11   meloxicam (MOBIC) 15 MG tablet Take 1 tablet (15 mg total) by mouth daily. 90 tablet 3   ondansetron (ZOFRAN-ODT) 8 MG disintegrating tablet Take 1 tablet (8 mg total) by mouth every 8 (eight) hours as needed for nausea. 20 tablet 0   sildenafil (REVATIO) 20 MG tablet Take 1-3 tablets as needed .5 hour before sexual intercourse. 90 tablet 3   terazosin (HYTRIN) 1 MG capsule Take 1 capsule (1 mg total) by mouth at bedtime. [place]  capsule 3   nystatin-triamcinolone ointment (MYCOLOG) Apply topically 2 (two) times daily as needed. 60 g 0   tadalafil (ADCIRCA/CIALIS) 20 MG tablet Take 1 tablet (20 mg total) by mouth daily as needed for erectile dysfunction. 10 tablet 5   No current facility-administered medications for this encounter.     REVIEW OF SYSTEMS:  On review of systems, the patient reports that he is doing well overall. He denies any chest pain, shortness of breath, cough, fevers, chills, night sweats, unintended weight changes. He denies any bowel disturbances, and denies abdominal pain, nausea or vomiting. He denies any new musculoskeletal or joint aches or pains. His IPSS was 6, indicating mild urinary symptoms. He reports erectile dysfunction, and his SHIM was 24 with medication. A complete review of systems is obtained and is otherwise negative.    PHYSICAL EXAM:  Wt Readings from Last 3 Encounters:  06/27/19 184 lb (83.5 kg)  02/20/19 178 lb (80.7 kg)  11/28/18 178 lb (80.7 kg)   Temp Readings from Last 3 Encounters:  11/08/15 98.1 F (36.7 C) (Oral)  08/31/14 98.1 F (36.7 C) (Oral)  09/01/13 97.9 F (36.6 C)   BP Readings from Last 3 Encounters:  02/20/19 92/88  11/28/18 118/86  05/24/18 133/86   Pulse Readings from Last 3 Encounters:  11/28/18 78  05/24/18 68  10/29/17 71   Pain Assessment Pain Score: 0-No pain/10  Unable to assess due to telephone consult visit format.   KPS = 100  100 - Normal; no complaints; no evidence of  disease. 90   - Able to carry on normal activity; minor signs or symptoms of disease. 80   - Normal activity with effort; some signs or symptoms of disease. 71   - Cares for self; unable to carry on normal activity or to do active work. 60   - Requires occasional assistance, but is able to care for most of his personal needs. 50   - Requires considerable assistance and frequent medical care. 45   - Disabled; requires special care and assistance. 54   - Severely disabled; hospital admission is indicated although death not imminent. 47   - Very sick; hospital admission necessary; active supportive treatment necessary. 10   - Moribund; fatal processes progressing rapidly. 0     - Dead  Karnofsky DA, Abelmann [place], Craver LS and Burchenal [facility] [phone]) The use of the nitrogen mustards in the palliative treatment of carcinoma: with particular reference to bronchogenic carcinoma Cancer 1 634-56  LABORATORY DATA:  Lab Results  Component Value Date   WBC 8.8 05/24/2018   HGB 15.5 05/24/2018   HCT 45.4 05/24/2018   MCV 90.8 05/24/2018   PLT 246 05/24/2018   Lab Results  Component Value Date   NA 139 11/28/2018   K 4.4 11/28/2018   CL 105 11/28/2018   CO2 26 11/28/2018   Lab Results  Component Value Date   ALT 18 05/24/2018   AST 12 05/24/2018   ALKPHOS 63 08/14/2016   BILITOT 0.7 05/24/2018     RADIOGRAPHY: No results found.    IMPRESSION/PLAN:  This visit was conducted via Telephone to spare the patient unnecessary potential exposure in the healthcare setting during the current COVID-19 pandemic.  1. 63 y.o. gentleman with Stage T1c adenocarcinoma of the prostate with Gleason Score of 4+3, and PSA of 11.5. We discussed the patient's workup and outlined the nature of prostate cancer in this setting. The patient's T stage, Gleason's score, and PSA put him  into the unfavorable intermediate risk group. Accordingly, he is eligible for a variety of potential treatment options including  brachytherapy, 5.5 weeks of external radiation, or prostatectomy. We discussed the available radiation techniques, and focused on the details and logistics and delivery. The patient is not an ideal candidate for brachytherapywith a prostate volume of 82.1 cc.  Therefore, we focused our discussion on the risks, benefits, short and long-term effects associated with daily external beam radiotherapy and compared and contrasted these with prostatectomy. We discussed the role of SpaceOAR gel in reducing the rectal toxicity associated with radiotherapy.  He was encouraged to ask questions were answered to his stated satisfaction.  At the end of the conversation the patient remains undecided regarding his treatment preference and would like some additional time to consider his options. He is leaning towards external beam radiation but also still considering prostatectomy. Since he lives close to the [place], [place] area, he has requested a referral to a radiation oncologist in that area to discuss daily treatments closer to his home.   We will make a referral for consult in the radiation oncology department at the [facility] at [facility] in [place] and will share our discussion with Dr. Alinda Money.  We look forward to following along in the care of this very nice gentleman and of course, should he elect to proceed with radiotherapy locally, we would be more than happy to continue to participate in his care.  Given current concerns for patient exposure during the COVID-19 pandemic, this encounter was conducted via telephone. The patient was notified in advance and was offered a MyChart meeting to allow for face to face communication but unfortunately reported that he did not have the appropriate resources/technology to support such a visit and instead preferred to proceed with telephone consult. The patient has given verbal consent for this type of encounter. The time spent during this  encounter was 60 minutes. The attendants for this meeting include Tyler Pita MD, Ashlyn Bruning PA-C, [place], patient, Worthington Murillo. During the encounter, Tyler Pita MD, Ashlyn Bruning PA-C, and scribe, Wilburn Mylar were located at [place].  Patient, Montrez Awalt was located at home.    Nicholos Johns, PA-C    Tyler Pita, MD  [facility] Direct Dial: [phone]   Fax: [phone] [email or web address]   Skype   LinkedIn  This document serves as a record of services personally performed by Tyler Pita, MD and Freeman Caldron, PA-C. It was created on their behalf by Wilburn Mylar, a trained medical scribe. The creation of this record is based on the scribe's personal observations and the provider's statements to them. This document has been checked and approved by the attending provider.

## 2019-06-27 NOTE — Progress Notes (Signed)
See progress note under physician encounter. 

## 2019-06-30 ENCOUNTER — Telehealth: Payer: Self-pay | Admitting: Medical Oncology

## 2019-06-30 NOTE — Telephone Encounter (Signed)
Left a message to introduce myself as the prostate nurse navigator and discuss my role. I was unable to meet him 9/8 when he consulted with Dr. Tammi Klippel. 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 He was undecided on treatment, but is considering radiation.He requested  a referral to the [facility] in [place], which is much closer to him home.  I informed him that records were faxed and he should be receiving a call for an appointment. I gave him my office number and asked him to call me with questions or if I can assist him further.

## 2019-07-06 ENCOUNTER — Telehealth: Payer: Self-pay | Admitting: Medical Oncology

## 2019-07-06 NOTE — Telephone Encounter (Signed)
Patient called, stating he received a call from [facility] with an appointment. He is not sure the date will work, due to driving long distance. I informed him this is just  a consult with radiation oncology and I am sure they will work with him to reschedule. I gave him the office number and asked him to reschedule. He was pleased with consult with Dr. Forrest Moron, but lives too far away to drive for daily radiation.I wished him the best and asked him to call me, if I can assist him, in any way. He voiced understanding and thanked me for getting him referred.

## 2019-08-04 LAB — HM COLONOSCOPY

## 2019-08-21 ENCOUNTER — Telehealth: Payer: Self-pay | Admitting: Oncology

## 2019-08-21 NOTE — Telephone Encounter (Signed)
Returned patient's phone call regarding cancelling 11/10 appointment, per patient's request appointment is cancelled. Patient will call back when ready to reschedule.

## 2019-08-22 ENCOUNTER — Inpatient Hospital Stay: Payer: BC Managed Care – PPO | Admitting: Oncology

## 2019-09-14 ENCOUNTER — Other Ambulatory Visit: Payer: Self-pay | Admitting: Physician Assistant

## 2020-02-01 ENCOUNTER — Other Ambulatory Visit: Payer: Self-pay | Admitting: Physician Assistant

## 2020-02-01 DIAGNOSIS — I10 Essential (primary) hypertension: Secondary | ICD-10-CM

## 2020-02-15 ENCOUNTER — Other Ambulatory Visit: Payer: Self-pay | Admitting: Physician Assistant

## 2020-02-15 DIAGNOSIS — I10 Essential (primary) hypertension: Secondary | ICD-10-CM

## 2020-03-28 ENCOUNTER — Other Ambulatory Visit: Payer: Self-pay | Admitting: Physician Assistant

## 2020-05-02 ENCOUNTER — Other Ambulatory Visit: Payer: Self-pay | Admitting: Physician Assistant

## 2020-05-17 ENCOUNTER — Other Ambulatory Visit: Payer: Self-pay | Admitting: Physician Assistant

## 2020-12-09 ENCOUNTER — Ambulatory Visit (INDEPENDENT_AMBULATORY_CARE_PROVIDER_SITE_OTHER): Payer: BC Managed Care – PPO | Admitting: Physician Assistant

## 2020-12-09 ENCOUNTER — Other Ambulatory Visit: Payer: Self-pay

## 2020-12-09 ENCOUNTER — Encounter: Payer: Self-pay | Admitting: Physician Assistant

## 2020-12-09 VITALS — BP 126/78 | HR 67 | Ht 71.0 in | Wt 178.0 lb

## 2020-12-09 DIAGNOSIS — N401 Enlarged prostate with lower urinary tract symptoms: Secondary | ICD-10-CM | POA: Diagnosis not present

## 2020-12-09 DIAGNOSIS — Z1329 Encounter for screening for other suspected endocrine disorder: Secondary | ICD-10-CM

## 2020-12-09 DIAGNOSIS — I10 Essential (primary) hypertension: Secondary | ICD-10-CM | POA: Diagnosis not present

## 2020-12-09 DIAGNOSIS — Z13220 Encounter for screening for lipoid disorders: Secondary | ICD-10-CM

## 2020-12-09 DIAGNOSIS — G479 Sleep disorder, unspecified: Secondary | ICD-10-CM

## 2020-12-09 DIAGNOSIS — Z131 Encounter for screening for diabetes mellitus: Secondary | ICD-10-CM

## 2020-12-09 DIAGNOSIS — N3943 Post-void dribbling: Secondary | ICD-10-CM

## 2020-12-09 DIAGNOSIS — K219 Gastro-esophageal reflux disease without esophagitis: Secondary | ICD-10-CM

## 2020-12-09 DIAGNOSIS — N5231 Erectile dysfunction following radical prostatectomy: Secondary | ICD-10-CM

## 2020-12-09 DIAGNOSIS — M545 Low back pain, unspecified: Secondary | ICD-10-CM

## 2020-12-09 DIAGNOSIS — Z0000 Encounter for general adult medical examination without abnormal findings: Secondary | ICD-10-CM

## 2020-12-09 DIAGNOSIS — F411 Generalized anxiety disorder: Secondary | ICD-10-CM

## 2020-12-09 DIAGNOSIS — G8929 Other chronic pain: Secondary | ICD-10-CM

## 2020-12-09 MED ORDER — ESOMEPRAZOLE MAGNESIUM 40 MG PO CPDR: DELAYED_RELEASE_CAPSULE | ORAL | 3 refills | Status: DC

## 2020-12-09 MED ORDER — DIAZEPAM 2 MG PO TABS: 2.0000 mg | ORAL_TABLET | Freq: Every evening | ORAL | 5 refills | Status: DC | PRN

## 2020-12-09 MED ORDER — TERAZOSIN HCL 1 MG PO CAPS: ORAL_CAPSULE | ORAL | 3 refills | Status: DC

## 2020-12-09 MED ORDER — MELOXICAM 15 MG PO TABS: 15.0000 mg | ORAL_TABLET | Freq: Every day | ORAL | 3 refills | Status: DC

## 2020-12-09 MED ORDER — SILDENAFIL CITRATE 50 MG PO TABS: 50.0000 mg | ORAL_TABLET | Freq: Every day | ORAL | 3 refills | Status: DC | PRN

## 2020-12-09 MED ORDER — AMLODIPINE BESYLATE 10 MG PO TABS: ORAL_TABLET | ORAL | 3 refills | Status: DC

## 2020-12-09 NOTE — Patient Instructions (Signed)

## 2020-12-09 NOTE — Progress Notes (Signed)
Subjective:    Patient ID: Jack Grimes, male    DOB: 1955-12-10, 65 y.o.   MRN: [id number]  HPI  Patient is a 65 year old male with history of prostate cancer, hypertension, GERD, history of lymphoma, hypertension who presents to the clinic for complete physical.  Overall patient is doing very well.  His biggest issue is his sleep.  He is having to work 6 PM to 6 AM.  He is sleeping from about 7-11 every day.  He has trouble going to sleep and staying asleep.  The weeks and days he does not work he has no problems sleeping at night.  He gets a lot of anxiety around not being able to sleep.  Valium has been helping.  He requests to stay on this until October.  He plans to retire in October.   Sleeping 6pm to 6 am 5 days a week.  Sleep 7 sleeping 4-5 hours.   .. Active Ambulatory Problems    Diagnosis Date Noted  . NEOPLASM, MALIGNANT, PROSTATE 10/27/2007  . ADJUSTMENT DISORDER WITH ANXIETY 01/21/2009  . VISUAL CHANGES 09/16/2009  . HYPERTENSION, BENIGN ESSENTIAL 06/09/2007  . Gastroesophageal reflux disease 10/25/2007  . ALLERGIC URTICARIA 01/21/2009  . BACK PAIN, LUMBAR 10/25/2007  . LYMPHOMA, HX OF 11/14/2007  . GAD (generalized anxiety disorder) 10/24/2012  . 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 Erectile dysfunction 01/23/2013  . BPH (benign prostatic hyperplasia) 01/23/2013  . Right lateral epicondylitis 02/10/2013  . Right knee pain 09/25/2013  . Erectile dysfunction of non-organic origin 05/11/2014  . Multiple joint pain 10/31/2014  . Subconjunctival hemorrhage of right eye 04/25/2015  . Carpal tunnel syndrome, left 04/25/2015  . Left shoulder pain 01/01/2016  . Strong pulse 08/14/2016  . 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/29/2017  . Trouble in sleeping 12/09/2020   Resolved Ambulatory Problems    Diagnosis Date Noted  . No Resolved Ambulatory Problems   Past Medical History:  Diagnosis Date  . Cancer ([place])   . GERD (gastroesophageal reflux disease)    . Hypertension   . Prostate cancer ([place])    .Marland Kitchen Family History  Problem Relation Age of Onset  . Prostate cancer Father 47  . Breast cancer Neg Hx   . Colon cancer Neg Hx    .Marland Kitchen Social History   Socioeconomic History  . Marital status: Married    Spouse name: Not on file  . Number of children: 1  . Years of education: Not on file  . Highest education level: Not on file  Occupational History  . Not on file  Tobacco Use  . Smoking status: Former Smoker    Packs/day: 1.00    Years: 20.00    Pack years: 20.00    Types: Cigarettes    Quit date: 10/12/2002    Years since quitting: 18.1  . Smokeless tobacco: Never Used  Vaping Use  . Vaping Use: Never used  Substance and Sexual Activity  . Alcohol use: Yes    Alcohol/week: 4.0 standard drinks    Types: 4 Cans of beer per week    Comment: per week  . Drug use: Never  . 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   Other Topics Concern  . Not on file  Social History Narrative  . Not on file   Social Determinants of Health   Financial Resource Strain: Not on file  Food Insecurity: Not on file  Transportation Needs: Not on file  Physical Activity:  Not on file  Stress: Not on file  Social Connections: Not on file  Intimate Partner Violence: Not on file      Review of Systems  All other systems reviewed and are negative.      Objective:   Physical Exam  BP 126/78   Pulse 67   Ht 5\' 11"  (1.803 m)   Wt 178 lb (80.7 kg)   SpO2 98%   BMI 24.83 kg/m   General Appearance:    Alert, cooperative, no distress, appears stated age  Head:    Normocephalic, without obvious abnormality, atraumatic  Eyes:    PERRL, conjunctiva/corneas clear, EOM's intact, fundi    benign, both eyes       Ears:    Normal TM's and external ear canals, both ears  Nose:   Nares normal, septum midline, mucosa normal, no drainage    or sinus tenderness  Throat:   Lips, mucosa, and tongue normal; teeth and  gums normal  Neck:   Supple, symmetrical, trachea midline, no adenopathy;       thyroid:  No enlargement/tenderness/nodules; no carotid   bruit or JVD  Back:     Symmetric, no curvature, ROM normal, no CVA tenderness  Lungs:     Clear to auscultation bilaterally, respirations unlabored  Chest wall:    No tenderness or deformity  Heart:    Regular rate and rhythm, S1 and S2 normal, no murmur, rub   or gallop  Abdomen:     Soft, non-tender, bowel sounds active all four quadrants,    no masses, no organomegaly        Extremities:   Extremities normal, atraumatic, no cyanosis or edema  Pulses:   2+ and symmetric all extremities  Skin:   Skin color, texture, turgor normal, no rashes or lesions  Lymph nodes:   Cervical, supraclavicular, and axillary nodes normal  Neurologic:   CNII-XII intact. Normal strength, sensation and reflexes      throughout       .Marland Kitchen Depression screen [facility] 2/9 12/09/2020 11/28/2018 05/24/2018 10/29/2017 08/31/2014  Decreased Interest 0 0 0 0 0  Down, Depressed, Hopeless 0 0 0 0 0  PHQ - 2 Score 0 0 0 0 0  Altered sleeping 1 - 0 0 -  Tired, decreased energy 0 - 0 0 -  Change in appetite 0 - 0 0 -  Feeling bad or failure about yourself  0 - 0 0 -  Trouble concentrating 0 - 0 0 -  Moving slowly or fidgety/restless 0 - 0 0 -  Suicidal thoughts 0 - 0 0 -  PHQ-9 Score 1 - 0 0 -  Difficult doing work/chores Not difficult at all - Not difficult at all Not difficult at all -   .. GAD 7 : Generalized Anxiety Score 12/09/2020 11/28/2018 05/24/2018  Nervous, Anxious, on Edge 0 0 0  Control/stop worrying 0 0 0  Worry too much - different things 0 0 0  Trouble relaxing 0 0 0  Restless 0 0 0  Easily annoyed or irritable 0 0 0  Afraid - awful might happen 0 0 0  Total GAD 7 Score 0 0 0  Anxiety Difficulty Not difficult at all Not difficult at all Not difficult at all   .Marland Kitchen  [place] Name 12/09/20 0900         During the last Month   Sensation of Bladder  not Empty Less than 1 time in 5  Urinate<2 hours after last Less than 1 time in 5     Mult. stop/start when voiding Not at all     Difficult to postpone voiding Not at all     Weak urinary stream Not at all     Push/strain to begin urination Not at all     Times per night up to urinate Less than 1 time in 5           OTHER   Total Score 3            Assessment & Plan:  .Marland KitchenJohn was seen today for annual exam.  Diagnoses and all orders for this visit:  Routine physical examination -     CBC with Differential/Platelet -     COMPLETE METABOLIC PANEL WITH GFR -     Lipid Panel w/reflex Direct LDL -     TSH -     PSA  Benign prostatic hyperplasia with post-void dribbling -     PSA -     terazosin (HYTRIN) 1 MG capsule; TAKE 1 CAPSULE BY MOUTH DAILY.  HYPERTENSION, BENIGN ESSENTIAL -     COMPLETE METABOLIC PANEL WITH GFR -     amLODipine (NORVASC) 10 MG tablet; TAKE 1 TABLET BY MOUTH EVERY DAY/  Lipid screening -     Lipid Panel w/reflex Direct LDL  Diabetes mellitus screening -     COMPLETE METABOLIC PANEL WITH GFR  Thyroid disorder screen -     TSH  Gastroesophageal reflux disease, unspecified whether esophagitis present -     esomeprazole (NEXIUM) 40 MG capsule; Take one capsule by mouth daily at 12 noon  Chronic bilateral low back pain without sciatica -     meloxicam (MOBIC) 15 MG tablet; Take 1 tablet (15 mg total) by mouth daily.  GAD (generalized anxiety disorder) -     Discontinue: diazepam (VALIUM) 2 MG tablet; Take 1 tablet (2 mg total) by mouth at bedtime as needed. -     diazepam (VALIUM) 2 MG tablet; Take 1 tablet (2 mg total) by mouth at bedtime as needed.  Erectile dysfunction after radical prostatectomy -     sildenafil (VIAGRA) 50 MG tablet; Take 1 tablet (50 mg total) by mouth daily as needed for erectile dysfunction.  Trouble in sleeping   .Marland KitchenStart a regular exercise program and make sure you are eating a healthy diet Try to eat 4 servings  of dairy a day or take a calcium supplement (500mg  twice a day). Your vaccines are up to date except shingrix. Discussed with patient today. He declined.  Fasting labs ordered.  PSA ordered.  AUA stable and controlled.  Colonoscopy UTD.  covid vaccine UTD.  Declined flu shot and shingrix.   Discussed sleep hygiene. Pt is going to retire in October. Valium is working and would like to stay on this for sleep. Ok to October but discussed other ways to treat trouble sleeping. He reports no trouble when not having to sleep in morning.

## 2020-12-10 LAB — CBC WITH DIFFERENTIAL/PLATELET
Absolute Monocytes: 580 cells/uL (ref 200–950)
Basophils Absolute: 31 cells/uL (ref 0–200)
Basophils Relative: 0.5 %
Eosinophils Absolute: 171 cells/uL (ref 15–500)
Eosinophils Relative: 2.8 %
HCT: 48.1 % (ref 38.5–50.0)
Hemoglobin: 16.8 g/dL (ref 13.2–17.1)
Lymphs Abs: 1623 cells/uL (ref 850–3900)
MCH: 31.5 pg (ref 27.0–33.0)
MCHC: 34.9 g/dL (ref 32.0–36.0)
MCV: 90.2 fL (ref 80.0–100.0)
MPV: 11.3 fL (ref 7.5–12.5)
Monocytes Relative: 9.5 %
Neutro Abs: 3697 cells/uL (ref 1500–7800)
Neutrophils Relative %: 60.6 %
Platelets: 226 10*3/uL (ref 140–400)
RBC: 5.33 10*6/uL (ref 4.20–5.80)
RDW: 12.3 % (ref 11.0–15.0)
Total Lymphocyte: 26.6 %
WBC: 6.1 10*3/uL (ref 3.8–10.8)

## 2020-12-10 LAB — COMPLETE METABOLIC PANEL WITH GFR
AG Ratio: 1.9 (calc) (ref 1.0–2.5)
ALT: 20 U/L (ref 9–46)
AST: 13 U/L (ref 10–35)
Albumin: 4.4 g/dL (ref 3.6–5.1)
Alkaline phosphatase (APISO): 64 U/L (ref 35–144)
BUN: 12 mg/dL (ref 7–25)
CO2: 28 mmol/L (ref 20–32)
Calcium: 9.4 mg/dL (ref 8.6–10.3)
Chloride: 106 mmol/L (ref 98–110)
Creat: 0.78 mg/dL (ref 0.70–1.25)
GFR, Est African American: 111 mL/min/{1.73_m2} (ref >=60)
GFR, Est Non African American: 96 mL/min/{1.73_m2} (ref >=60)
Globulin: 2.3 g/dL (calc) (ref 1.9–3.7)
Glucose, Bld: 95 mg/dL (ref 65–99)
Potassium: 4.5 mmol/L (ref 3.5–5.3)
Sodium: 141 mmol/L (ref 135–146)
Total Bilirubin: 1.4 mg/dL — ABNORMAL HIGH (ref 0.2–1.2)
Total Protein: 6.7 g/dL (ref 6.1–8.1)

## 2020-12-10 LAB — LIPID PANEL W/REFLEX DIRECT LDL
Cholesterol: 143 mg/dL (ref <200)
HDL: 41 mg/dL (ref >=40)
LDL Cholesterol (Calc): 87 mg/dL (calc)
Non-HDL Cholesterol (Calc): 102 mg/dL (calc) (ref <130)
Total CHOL/HDL Ratio: 3.5 (calc) (ref <5.0)
Triglycerides: 65 mg/dL (ref <150)

## 2020-12-10 LAB — PSA: PSA: 0.63 ng/mL (ref <=4.0)

## 2020-12-10 LAB — TSH: TSH: 1.14 mIU/L (ref 0.40–4.50)

## 2020-12-10 NOTE — Progress Notes (Signed)
Jack Grimes,   Hemoglobin great.  Cholesterol GREAT.  PSA wonderful! Nice and low.  Thyroid perfect.  Kidney and glucose look good.  Liver enzymes look great.  Your bilirubin is up some but liver looks great. Many things including exercise can cause bilirubin to increase. If you are not having any abdominal pain or acute symptoms my suggestion would be to limit tylenol and alcohol consumption and recheck in 3-6 months.

## 2021-01-20 ENCOUNTER — Other Ambulatory Visit: Payer: Self-pay

## 2021-01-20 ENCOUNTER — Ambulatory Visit: Payer: BC Managed Care – PPO | Admitting: Physician Assistant

## 2021-01-20 ENCOUNTER — Encounter: Payer: Self-pay | Admitting: Physician Assistant

## 2021-01-20 VITALS — BP 121/80 | HR 84 | Ht 71.0 in | Wt 177.0 lb

## 2021-01-20 DIAGNOSIS — L723 Sebaceous cyst: Secondary | ICD-10-CM | POA: Insufficient documentation

## 2021-01-20 DIAGNOSIS — R17 Unspecified jaundice: Secondary | ICD-10-CM | POA: Insufficient documentation

## 2021-01-20 DIAGNOSIS — Z85820 Personal history of malignant melanoma of skin: Secondary | ICD-10-CM | POA: Diagnosis not present

## 2021-01-20 DIAGNOSIS — N5231 Erectile dysfunction following radical prostatectomy: Secondary | ICD-10-CM

## 2021-01-20 DIAGNOSIS — R0683 Snoring: Secondary | ICD-10-CM

## 2021-01-20 DIAGNOSIS — D229 Melanocytic nevi, unspecified: Secondary | ICD-10-CM | POA: Insufficient documentation

## 2021-01-20 NOTE — Progress Notes (Signed)
Subjective:    Patient ID: Jack Grimes, male    DOB: 1956-05-14, 65 y.o.   MRN: [id number]  HPI  Pt is a 65 yo male with HTN, GERD, ED, BPH, hx of melanoma who presents to the clinic with multiple questions.   He has hx of melanoma many years ago and starting to notice more "dark moles". This makes him worried. Not sure where he went before but was in [place].   His viagra mg is different than was before and he just "wonders why". Working well and affordable.   Snoring more over the last month. Has allergies. Using flonase. Has a bump on his left nare and wonders what this is. Non tender. Not draining.  Concerned about elevated bilirubin in last labs.      .. Active Ambulatory Problems    Diagnosis Date Noted  . NEOPLASM, MALIGNANT, PROSTATE 10/27/2007  . ADJUSTMENT DISORDER WITH ANXIETY 01/21/2009  . VISUAL CHANGES 09/16/2009  . HYPERTENSION, BENIGN ESSENTIAL 06/09/2007  . Gastroesophageal reflux disease 10/25/2007  . ALLERGIC URTICARIA 01/21/2009  . BACK PAIN, LUMBAR 10/25/2007  . LYMPHOMA, HX OF 11/14/2007  . GAD (generalized anxiety disorder) 10/24/2012  . 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 Erectile dysfunction 01/23/2013  . BPH (benign prostatic hyperplasia) 01/23/2013  . Right lateral epicondylitis 02/10/2013  . Right knee pain 09/25/2013  . Erectile dysfunction of non-organic origin 05/11/2014  . Multiple joint pain 10/31/2014  . Subconjunctival hemorrhage of right eye 04/25/2015  . Carpal tunnel syndrome, left 04/25/2015  . Left shoulder pain 01/01/2016  . Strong pulse 08/14/2016  . 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/29/2017  . Trouble in sleeping 12/09/2020  . History of melanoma 01/20/2021  . Multiple atypical skin moles 01/20/2021  . Snoring 01/20/2021  . Elevated bilirubin 01/20/2021  . Sebaceous cyst 01/20/2021   Resolved Ambulatory Problems    Diagnosis Date Noted  . No Resolved Ambulatory Problems   Past Medical History:  Diagnosis  Date  . Cancer ([place])   . GERD (gastroesophageal reflux disease)   . Hypertension   . Prostate cancer [facility])        Review of Systems See HPI.     Objective:   Physical Exam Vitals reviewed.  Constitutional:      Appearance: Normal appearance.  HENT:     Nose: Congestion present.     Comments: Bilateral swollen nasal turbinates.  Neck:     Vascular: No carotid bruit.  Cardiovascular:     Rate and Rhythm: Normal rate and regular rhythm.     Pulses: Normal pulses.     Heart sounds: Normal heart sounds.  Pulmonary:     Effort: Pulmonary effort is normal.  Skin:    Comments: Right upper leg 43mm symmetric Circular hyperpigmented macule. Smaller nevus diffuse all over upper and lower extremity.   Neck-skin tags.   Upper mid  back- raised papule with a portion hyperpigmented about 1cm by 1cm.   Neurological:     General: No focal deficit present.     Mental Status: He is alert and oriented to person, place, and time.  Psychiatric:     Comments: anxious           Assessment & Plan:  .Marland KitchenJohn was seen today for skin problem.  Diagnoses and all orders for this visit:  Multiple atypical skin moles -     Ambulatory referral to Dermatology  History of melanoma -     Ambulatory referral to Dermatology  Erectile dysfunction  after radical prostatectomy  Snoring  Elevated bilirubin -     COMPLETE METABOLIC PANEL WITH GFR -     Lactate dehydrogenase -     CBC -     Pathologist smear review -     Gamma GT -     Bilirubin, fractionated (tot/dir/indir)  Sebaceous cyst   Does have a personal history of melanoma.  He should be getting yearly skin checks. I do not have any urgent concerns after looking at nevus and skin tags today. Referral placed. He does appear to have a seb cyst on left nare on exam. Discussed warm compresses.   Discussed sent 50mg  Viagra instead of 20mg . He is ok with switch and working well.   Discussed snoring. Continue flonase add zytrec for  allergies. Consider medrol dose pack if continues. Nasal turbinates and red and swollen. Ok to use breath nasal strips.   Discussed elevated bilirubin. Recheck next month. 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 Liver enzymes great.

## 2021-01-20 NOTE — Patient Instructions (Addendum)
Epidermoid Cyst  An epidermoid cyst, also called an epidermal cyst, is a small lump under your skin. The cyst contains a substance called keratin. Do not try to pop or open the cyst yourself. What are the causes?  A blocked hair follicle.  A hair that curls and re-enters the skin instead of growing straight out of the skin.  A blocked pore.  Irritated skin.  An injury to the skin.  Certain conditions that are passed along from parent to child.  Human papillomavirus (HPV). This happens rarely when cysts occur on the bottom of the feet.  Long-term sun damage to the skin. What increases the risk?  Having acne.  Being male.  Having an injury to the skin.  Being past puberty.  Having certain conditions caused by genes (genetic disorder) What are the signs or symptoms? These cysts are usually harmless, but they can get infected. Symptoms of infection may include:  Redness.  Inflammation.  Tenderness.  Warmth.  Fever.  A bad-smelling substance that drains from the cyst.  Pus that drains from the cyst. How is this treated? In many cases, epidermoid cysts go away on their own without treatment. If a cyst becomes infected, treatment may include:  Opening and draining the cyst, done by a doctor. After draining, you may need minor surgery to remove the rest of the cyst.  Antibiotic medicine.  Shots of medicines (steroids) that help to reduce inflammation.  Surgery to remove the cyst. Surgery may be done if the cyst: ? Becomes large. ? Bothers you. ? Has a chance of turning into cancer.  Do not try to open a cyst yourself. Follow these instructions at home: Medicines  Take over-the-counter and prescription medicines as told by your doctor.  If you were prescribed an antibiotic medicine, take it as told by your doctor. Do not stop taking it even if you start to feel better. General instructions  Keep the area around your cyst clean and dry.  Wear loose, dry  clothing.  Avoid touching your cyst.  Check your cyst every day for signs of infection. Check for: ? Redness, swelling, or pain. ? Fluid or blood. ? Warmth. ? Pus or a bad smell.  Keep all follow-up visits. How is this prevented?  Wear clean, dry, clothing.  Avoid wearing tight clothing.  Keep your skin clean and dry. Take showers or baths every day. Contact a doctor if:  Your cyst has symptoms of infection.  Your condition does not improve or gets worse.  You have a cyst that looks different from other cysts you have had.  You have a fever. Get help right away if:  Redness spreads from the cyst into the area close by. Summary  An epidermoid cyst is a small lump under your skin.  If a cyst becomes infected, treatment may include surgery to open and drain the cyst, or to remove it.  Take over-the-counter and prescription medicines only as told by your doctor.  Contact a doctor if your condition is not improving or is getting worse.  Keep all follow-up visits. This information is not intended to replace advice given to you by your health care provider. Make sure you discuss any questions you have with your health care provider. Document Revised: 01/03/2020 Document Reviewed: 01/03/2020 Elsevier Patient Education  2021 Elsevier Inc.  

## 2021-05-04 NOTE — Progress Notes (Signed)
Acute Office Visit  Subjective:    Patient ID: Jack Grimes, male    DOB: Oct 30, 1955, 65 y.o.   MRN: [id number]  Chief Complaint  Patient presents with   Varicose Veins   Shoulder Pain    HPI Patient is in today for varicose veins and shoulder pain.  Patient first noticed some varicose veins in the back of both of his calves about a month and 1/2 to 2 months ago.  States they have looked the same since then.  He reports they are just bulging, no pain no bleeding no skin changes.  He does not really notice them unless he looks at them.  He is just curious if there is anything that should be done about them.  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   Additionally he would like to follow-up on his chronic left shoulder pain.  States many years ago he saw his PCP for possibly bursitis.  2019 x-rays showed some severe degenerative joint changes.  States his symptoms come and go randomly, but seem to be gradually getting a little bit worse.  Pain is 0 out of 10 at rest, cut can be 5 out of 10 with activity at times. States he has limited range of motion and stiffness with occasional pain.  Reports his PCP mentioned getting injections several years ago and he would like to talk to Dr. Darene Lamer about possibly doing this.  He is not getting much relief with heat, ice, massage.  Meloxicam does seem to help however he does not want to take it all the time.      Past Medical History:  Diagnosis Date   BPH (benign prostatic hyperplasia)    urology [place]   Cancer [facility])    low grade follicular lymphoma grade 2, stage 1E, S/P external beam radiation, prostate CA    GERD (gastroesophageal reflux disease)    Hypertension    Prostate cancer ([place])     Past Surgical History:  Procedure Laterality Date   axillary lymphoma     Left   PROSTATE BIOPSY     TONSILLECTOMY      Family History  Problem Relation Age of Onset   Prostate cancer  Father 18   Breast cancer Neg Hx    Colon cancer Neg Hx     Social History   Socioeconomic History   Marital status: Married    Spouse name: Not on file   Number of children: 1   Years of education: Not on file   Highest education level: Not on file  Occupational History   Not on file  Tobacco Use   Smoking status: Former    Packs/day: 1.00    Years: 20.00    Pack years: 20.00    Types: Cigarettes    Quit date: 10/12/2002    Years since quitting: 18.5   Smokeless tobacco: Never  Vaping Use   Vaping Use: Never used  Substance and Sexual Activity   Alcohol use: Yes    Alcohol/week: 4.0 standard drinks    Types: 4 Cans of beer per week    Comment: per week   Drug use: Never   Sexual activity: Yes    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   Other Topics Concern   Not on file  Social History Narrative   Not on file   Social Determinants of Health   Financial  Resource Strain: Not on file  Food Insecurity: Not on file  Transportation Needs: Not on file  Physical Activity: Not on file  Stress: Not on file  Social Connections: Not on file  Intimate Partner Violence: Not on file    Outpatient Medications Prior to Visit  Medication Sig Dispense Refill   Amino Acids (DAILY AMINO 6000 PO) Take 40 mg by mouth daily.     amLODipine (NORVASC) 10 MG tablet TAKE 1 TABLET BY MOUTH EVERY DAY/ 90 tablet 3   aspirin 81 MG tablet Take 81 mg by mouth daily.     diazepam (VALIUM) 2 MG tablet Take 1 tablet (2 mg total) by mouth at bedtime as needed. 30 tablet 5   esomeprazole (NEXIUM) 40 MG capsule Take one capsule by mouth daily at 12 noon 90 capsule 3   fluticasone (FLONASE) 50 MCG/ACT nasal spray Place 1 spray into both nostrils daily. 16 g 11   meloxicam (MOBIC) 15 MG tablet Take 1 tablet (15 mg total) by mouth daily. 90 tablet 3   nystatin-triamcinolone ointment (MYCOLOG) Apply topically 2 (two) times daily as needed. 60 g 0   ondansetron (ZOFRAN-ODT) 8 MG  disintegrating tablet Take 1 tablet (8 mg total) by mouth every 8 (eight) hours as needed for nausea. 20 tablet 0   sildenafil (VIAGRA) 50 MG tablet Take 1 tablet (50 mg total) by mouth daily as needed for erectile dysfunction. 30 tablet 3   terazosin (HYTRIN) 1 MG capsule TAKE 1 CAPSULE BY MOUTH DAILY. 90 capsule 3   No facility-administered medications prior to visit.    Allergies  Allergen Reactions   Lisinopril-Hydrochlorothiazide Swelling    Calf swelling.   Sulfonamide Derivatives Hives and Itching   Ciprofloxacin Hives    Review of Systems All review of systems negative except what is listed in the HPI     Objective:    Physical Exam Vitals reviewed.  Constitutional:      Appearance: Normal appearance. He is normal weight.  Musculoskeletal:        General: No swelling.     Comments: L shoulder decreased range of motion and discomfort with rotation internal/external/abduction/adduction  Skin:    Capillary Refill: Capillary refill takes less than 2 seconds.     Comments: Varicose veins to left and right calf - see picture  Neurological:     General: No focal deficit present.     Mental Status: He is alert and oriented to person, place, and time.  Psychiatric:        Mood and Affect: Mood normal.        Behavior: Behavior normal.        Thought Content: Thought content normal.        Judgment: Judgment normal.         BP 124/77   Pulse (!) 58   Temp 98 F (36.7 C)   SpO2 97%  Wt Readings from Last 3 Encounters:  01/20/21 177 lb (80.3 kg)  12/09/20 178 lb (80.7 kg)  06/27/19 184 lb (83.5 kg)    Health Maintenance Due  Topic Date Due   Pneumococcal Vaccine 34-37 Years old (1 - PCV) Never done   Zoster Vaccines- Shingrix (1 of 2) Never done   COVID-19 Vaccine (4 - Booster for Pfizer series) 01/26/2021    There are no preventive care reminders to display for this patient.   Lab Results  Component Value Date   TSH 1.14 12/09/2020   Lab Results   Component  Value Date   WBC 6.1 12/09/2020   HGB 16.8 12/09/2020   HCT 48.1 12/09/2020   MCV 90.2 12/09/2020   PLT 226 12/09/2020   Lab Results  Component Value Date   NA 141 12/09/2020   K 4.5 12/09/2020   CHLORIDE 105 11/08/2015   CO2 28 12/09/2020   GLUCOSE 95 12/09/2020   BUN 12 12/09/2020   CREATININE 0.78 12/09/2020   BILITOT 1.4 (H) 12/09/2020   ALKPHOS 63 08/14/2016   AST 13 12/09/2020   ALT 20 12/09/2020   PROT 6.7 12/09/2020   ALBUMIN 4.3 08/14/2016   CALCIUM 9.4 12/09/2020   ANIONGAP 7 11/08/2015   EGFR >90 11/08/2015   Lab Results  Component Value Date   CHOL 143 12/09/2020   Lab Results  Component Value Date   HDL 41 12/09/2020   Lab Results  Component Value Date   LDLCALC 87 12/09/2020   Lab Results  Component Value Date   TRIG 65 12/09/2020   Lab Results  Component Value Date   CHOLHDL 3.5 12/09/2020   Lab Results  Component Value Date   HGBA1C 5.0 07/20/2012       Assessment & Plan:   1. Asymptomatic varicose veins of both lower extremities - No red flags today - Compression socks, elevation, skin care, regular exercise   2. Chronic left shoulder pain - Will go ahead and repeat x-rays as it has been several years - Gave him some stretches good for arthritis/frozen shoulder - Can take meloxicam and continue heat/ice/massage for comfort - Follow-up with Dr. Darene Lamer, Sports Medicine, for possible injections whenever patient's schedule allows  - DG Shoulder Left; Future  Follow-up with Dr. Darene Lamer for shoulder injections or as needed.   Purcell Nails Olevia Bowens, DNP, FNP-C

## 2021-05-05 ENCOUNTER — Other Ambulatory Visit: Payer: Self-pay

## 2021-05-05 ENCOUNTER — Encounter: Payer: Self-pay | Admitting: Family Medicine

## 2021-05-05 ENCOUNTER — Ambulatory Visit (INDEPENDENT_AMBULATORY_CARE_PROVIDER_SITE_OTHER): Payer: BC Managed Care – PPO | Admitting: Family Medicine

## 2021-05-05 ENCOUNTER — Ambulatory Visit (INDEPENDENT_AMBULATORY_CARE_PROVIDER_SITE_OTHER): Payer: BC Managed Care – PPO

## 2021-05-05 VITALS — BP 124/77 | HR 58 | Temp 98.0°F

## 2021-05-05 DIAGNOSIS — I8393 Asymptomatic varicose veins of bilateral lower extremities: Secondary | ICD-10-CM | POA: Diagnosis not present

## 2021-05-05 DIAGNOSIS — M25512 Pain in left shoulder: Secondary | ICD-10-CM | POA: Diagnosis not present

## 2021-05-05 DIAGNOSIS — G8929 Other chronic pain: Secondary | ICD-10-CM | POA: Diagnosis not present

## 2021-05-06 NOTE — Progress Notes (Signed)
No acute fractures or dislocation. Findings more consistent with degenerative changes and likely arthritis. Follow-up with Dr. Darene Lamer (sports medicine) in a few weeks after trying the stretches/exercises and meloxicam like we discussed.

## 2021-05-26 ENCOUNTER — Ambulatory Visit: Payer: BC Managed Care – PPO | Admitting: Sports Medicine

## 2021-06-02 ENCOUNTER — Ambulatory Visit: Payer: BC Managed Care – PPO | Admitting: Sports Medicine

## 2021-07-21 ENCOUNTER — Other Ambulatory Visit: Payer: Self-pay | Admitting: Physician Assistant

## 2021-07-21 DIAGNOSIS — F411 Generalized anxiety disorder: Secondary | ICD-10-CM

## 2021-07-21 NOTE — Telephone Encounter (Signed)
Last written 12/09/2020 #30 with 5 refills Last appt 01/20/2021 (acute visit 05/05/2021)

## 2021-08-04 ENCOUNTER — Ambulatory Visit (INDEPENDENT_AMBULATORY_CARE_PROVIDER_SITE_OTHER): Payer: BC Managed Care – PPO | Admitting: Sports Medicine

## 2021-08-04 ENCOUNTER — Ambulatory Visit (INDEPENDENT_AMBULATORY_CARE_PROVIDER_SITE_OTHER): Payer: BC Managed Care – PPO

## 2021-08-04 ENCOUNTER — Other Ambulatory Visit: Payer: Self-pay

## 2021-08-04 ENCOUNTER — Ambulatory Visit: Payer: BC Managed Care – PPO | Admitting: Physician Assistant

## 2021-08-04 VITALS — BP 112/76 | HR 69 | Ht 71.0 in | Wt 176.0 lb

## 2021-08-04 DIAGNOSIS — N5231 Erectile dysfunction following radical prostatectomy: Secondary | ICD-10-CM

## 2021-08-04 DIAGNOSIS — M25512 Pain in left shoulder: Secondary | ICD-10-CM

## 2021-08-04 DIAGNOSIS — M5412 Radiculopathy, cervical region: Secondary | ICD-10-CM | POA: Diagnosis not present

## 2021-08-04 DIAGNOSIS — K4090 Unilateral inguinal hernia, without obstruction or gangrene, not specified as recurrent: Secondary | ICD-10-CM | POA: Diagnosis not present

## 2021-08-04 DIAGNOSIS — F411 Generalized anxiety disorder: Secondary | ICD-10-CM | POA: Diagnosis not present

## 2021-08-04 DIAGNOSIS — M542 Cervicalgia: Secondary | ICD-10-CM | POA: Diagnosis not present

## 2021-08-04 DIAGNOSIS — K635 Polyp of colon: Secondary | ICD-10-CM | POA: Insufficient documentation

## 2021-08-04 DIAGNOSIS — I10 Essential (primary) hypertension: Secondary | ICD-10-CM | POA: Diagnosis not present

## 2021-08-04 DIAGNOSIS — M545 Low back pain, unspecified: Secondary | ICD-10-CM | POA: Diagnosis not present

## 2021-08-04 DIAGNOSIS — C61 Malignant neoplasm of prostate: Secondary | ICD-10-CM

## 2021-08-04 DIAGNOSIS — G8929 Other chronic pain: Secondary | ICD-10-CM | POA: Diagnosis not present

## 2021-08-04 DIAGNOSIS — M19012 Primary osteoarthritis, left shoulder: Secondary | ICD-10-CM

## 2021-08-04 DIAGNOSIS — G479 Sleep disorder, unspecified: Secondary | ICD-10-CM

## 2021-08-04 MED ORDER — TRAZODONE HCL 50 MG PO TABS: 25.0000 mg | ORAL_TABLET | Freq: Every evening | ORAL | 1 refills | Status: DC | PRN

## 2021-08-04 MED ORDER — SILDENAFIL CITRATE 50 MG PO TABS: 50.0000 mg | ORAL_TABLET | Freq: Every day | ORAL | 5 refills | Status: DC | PRN

## 2021-08-04 MED ORDER — DIAZEPAM 2 MG PO TABS: 2.0000 mg | ORAL_TABLET | Freq: Every evening | ORAL | 1 refills | Status: DC | PRN

## 2021-08-04 MED ORDER — CELECOXIB 200 MG PO CAPS: ORAL_CAPSULE | ORAL | 2 refills | Status: DC

## 2021-08-04 NOTE — Progress Notes (Signed)
Subjective:    Patient ID: Jack Grimes, male    DOB: Nov 01, 1955, 65 y.o.   MRN: [id number]  HPI Pt is a 65 yo male with HTN, prostate cancer, colon polyps, ED, GAD who presents to the clinic for follow up and medication refills.   Pt is doing great on norvasc. No CP, palpitations, headaches, vision changes. He does not check BP at home.   Pt did have colonoscopy done in [place] at [facility] in 07/2019. Polyps were found and told to repeat in 5 years.   He just finished radiation treatment with Dr. Kirby Crigler for prostate cancer. He will follow back up with urology, Dr. Alinda Money, at this time.   Needs refills on viagra.   Pt is not sleeping great. 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 He uses valium from time to time but does not want to take every day. Melatonoin did not work.   Pt has noticed a left inguinal bulge for the last few weeks. There is some pulling in the groin area. Not sure when happened. Hx of right inguinal hernia 40 years ago.   .. 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   Erectile dysfunction 01/23/2013   BPH (benign prostatic hyperplasia) 01/23/2013   Right lateral epicondylitis 02/10/2013   Right knee pain 09/25/2013   Erectile dysfunction of non-organic origin 05/11/2014   Multiple joint pain 10/31/2014   Subconjunctival hemorrhage of right eye 04/25/2015   Carpal tunnel syndrome, left 04/25/2015   Left shoulder pain 01/01/2016   Strong pulse 08/14/2016   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/29/2017   Trouble in sleeping 12/09/2020    History of melanoma 01/20/2021   Multiple atypical skin moles 01/20/2021   Snoring 01/20/2021   Elevated bilirubin 01/20/2021   Sebaceous cyst 01/20/2021   Left inguinal hernia 08/04/2021   Left cervical radiculopathy 08/04/2021   Hyperplastic colonic polyp 08/04/2021   Resolved Ambulatory Problems    Diagnosis Date Noted   No Resolved Ambulatory Problems   Past Medical History:  Diagnosis Date   Cancer ([place])    GERD (gastroesophageal reflux disease)    Hypertension    Prostate cancer ([place])       Review of Systems See HPI.     Objective:   Physical Exam Vitals reviewed.  Constitutional:      Appearance: Normal appearance.  HENT:     Head: Normocephalic.  Neck:     Vascular: No carotid bruit.  Cardiovascular:     Rate and Rhythm: Normal rate and regular rhythm.     Pulses: Normal pulses.  Pulmonary:     Effort: Pulmonary effort is normal.     Breath sounds: Normal breath sounds.  Abdominal:     General: Bowel sounds are normal.     Palpations: Abdomen is soft.     Hernia: A hernia is present.     Comments: Reducible left inguinal hernia.   Musculoskeletal:     Right lower leg: No edema.     Left lower leg: No edema.  Lymphadenopathy:     Cervical: No cervical adenopathy.  Neurological:  General: No focal deficit present.     Mental Status: He is alert and oriented to person, place, and time.  Psychiatric:        Mood and Affect: Mood normal.     Comments: Anxious.           Assessment & Plan:  .Marland KitchenJohn was seen today for anxiety.  Diagnoses and all orders for this visit:  Left inguinal hernia -     Ambulatory referral to General Surgery  GAD (generalized anxiety disorder) -     diazepam (VALIUM) 2 MG tablet; Take 1 tablet (2 mg total) by mouth at bedtime as needed.  Erectile dysfunction after radical prostatectomy -     sildenafil (VIAGRA) 50 MG tablet; Take 1 tablet (50 mg total) by mouth daily as needed for erectile  dysfunction.  HYPERTENSION, BENIGN ESSENTIAL  Hyperplastic colonic polyp, unspecified part of colon  NEOPLASM, MALIGNANT, PROSTATE  Trouble in sleeping -     traZODone (DESYREL) 50 MG tablet; Take 0.5-2 tablets (25-100 mg total) by mouth at bedtime as needed for sleep.  Need to update colonoscopy in EMR to 07/2019. Hx of colon polyps. Follow up in 5 years, due 2025  Needs to follow up with urologist for prostate cancer after radiation. Last PsA had improved. He should be able to make this appt on his own.   Discussed trying trazodone for sleep. Sent to pharmacy. Refilled valium for as needed.   BP looks great. Refill norvasc.   Left inguinal hernia. Needs referral for surgery. Discussed red flag signs and symptoms.

## 2021-08-04 NOTE — Patient Instructions (Signed)
Inguinal Hernia, Adult An inguinal hernia is when fat or your intestines push through a weak spot in a muscle where your leg meets your lower belly (groin). This causes a bulge. This kind of hernia could also be: In your scrotum, if you are male. In folds of skin around your vagina, if you are male. There are three types of inguinal hernias: Hernias that can be pushed back into the belly (are reducible). This type rarely causes pain. Hernias that cannot be pushed back into the belly (are incarcerated). Hernias that cannot be pushed back into the belly and lose their blood supply (are strangulated). This type needs emergency surgery. What are the causes? This condition is caused by having a weak spot in the muscles or tissues in your groin. This develops over time. The hernia may poke through the weak spot when you strain your lower belly muscles all of a sudden, such as when you: Lift a heavy object. Strain to poop (have a bowel movement). Trouble pooping (constipation) can lead to straining. Cough. What increases the risk? This condition is more likely to develop in: Males. Pregnant females. People who: Are overweight. Work in jobs that require long periods of standing or heavy lifting. Have had an inguinal hernia before. Smoke or have lung disease. These factors can lead to long-term (chronic) coughing. What are the signs or symptoms? Symptoms may depend on the size of the hernia. Often, a small hernia has no symptoms. Symptoms of a larger hernia may include: A bulge in the groin area. This is easier to see when standing. You might not be able to see it when you are lying down. Pain or burning in the groin. This may get worse when you lift, strain, or cough. A dull ache or a feeling of pressure in the groin. An abnormal bulge in the scrotum, in males. Symptoms of a strangulated inguinal hernia may include: A bulge in your groin that is very painful and tender to the touch. A bulge  that turns red or purple. Fever, feeling like you may vomit (nausea), and vomiting. Not being able to poop or to pass gas. How is this treated? Treatment depends on the size of your hernia and whether you have symptoms. If you do not have symptoms, your doctor may have you watch your hernia carefully and have you come in for follow-up visits. If your hernia is large or if you have symptoms, you may need surgery to repair the hernia. Follow these instructions at home: Lifestyle Avoid lifting heavy objects. Avoid standing for long amounts of time. Do not smoke or use any products that contain nicotine or tobacco. If you need help quitting, ask your doctor. Stay at a healthy weight. Prevent trouble pooping You may need to take these actions to prevent or treat trouble pooping: Drink enough fluid to keep your pee (urine) pale yellow. Take over-the-counter or prescription medicines. Eat foods that are high in fiber. These include beans, whole grains, and fresh fruits and vegetables. Limit foods that are high in fat and sugar. These include fried or sweet foods. General instructions You may try to push your hernia back in place by very gently pressing on it when you are lying down. Do not try to push the bulge back in if it will not go in easily. Watch your hernia for any changes in shape, size, or color. Tell your doctor if you see any changes. Take over-the-counter and prescription medicines only as told by your doctor. Keep  all follow-up visits. Contact a doctor if: You have a fever or chills. You have new symptoms. Your symptoms get worse. Get help right away if: You have pain in your groin that gets worse all of a sudden. You have a bulge in your groin that: Gets bigger all of a sudden, and it does not get smaller after that. Turns red or purple. Is painful when you touch it. You are a male, and you have: Sudden pain in your scrotum. A sudden change in the size of your scrotum. You  cannot push the hernia back in place by very gently pressing on it when you are lying down. You feel like you may vomit, and that feeling does not go away. You keep vomiting. You have a fast heartbeat. You cannot poop or pass gas. These symptoms may be an emergency. Get help right away. Call your local emergency services (911 in the [place] Do not wait to see if the symptoms will go away. Do not drive yourself to the hospital. Summary An inguinal hernia is when fat or your intestines push through a weak spot in a muscle where your leg meets your lower belly (groin). This causes a bulge. If you do not have symptoms, you may not need treatment. If you have symptoms or a large hernia, you may need surgery. Avoid lifting heavy objects. Also, avoid standing for long amounts of time. Do not try to push the bulge back in if it will not go in easily. This information is not intended to replace advice given to you by your health care provider. Make sure you discuss any questions you have with your health care provider. Document Revised: 05/28/2020 Document Reviewed: 05/28/2020 Elsevier Patient Education  2022 [employer].

## 2021-08-04 NOTE — Progress Notes (Signed)
    Procedures performed today:    None.  Independent interpretation of notes and tests performed by another provider:   None.  Brief History, Exam, Impression, and Recommendations:    Localized osteoarthritis of left shoulder Jack Grimes is a very pleasant 65 year old male, he has had chronic left shoulder pain, loss of motion, x-rays did confirm end-stage osteoarthritis of the left shoulder. We discussed restarting NSAIDs, Celebrex this time as his GI doc has discouraged meloxicam, he will do Nexium with it. Adding shoulder conditioning exercises. If no better or not sufficiently improved in 4 to 6 weeks we will do a glenohumeral joint injection.  Left cervical radiculopathy Jack Grimes also has pain in his neck with radiation down the left arm. Often happens in the morning as well, he does have a history of carpal tunnel syndrome, I think there may be a radicular component as well, adding C-spine x-rays, the Celebrex should help his neck. Adding C-spine conditioning, return to see me in 4 to 6 weeks, MRI for interventional planning if no better.    ___________________________________________ Gwen Her. Dianah Field, M.D., ABFM., CAQSM. Primary Care and [place] [employer] [facility] [employer]

## 2021-08-04 NOTE — Assessment & Plan Note (Signed)
Jack Grimes also has pain in his neck with radiation down the left arm. Often happens in the morning as well, he does have a history of carpal tunnel syndrome, I think there may be a radicular component as well, adding C-spine x-rays, the Celebrex should help his neck. Adding C-spine conditioning, return to see me in 4 to 6 weeks, MRI for interventional planning if no better.

## 2021-08-04 NOTE — Assessment & Plan Note (Signed)
Jack Grimes is a very pleasant 65 year old male, he has had chronic left shoulder pain, loss of motion, x-rays did confirm end-stage osteoarthritis of the left shoulder. We discussed restarting NSAIDs, Celebrex this time as his GI doc has discouraged meloxicam, he will do Nexium with it. Adding shoulder conditioning exercises. If no better or not sufficiently improved in 4 to 6 weeks we will do a glenohumeral joint injection.

## 2021-08-05 ENCOUNTER — Encounter: Payer: Self-pay | Admitting: Physician Assistant

## 2021-08-05 LAB — BILIRUBIN, FRACTIONATED(TOT/DIR/INDIR)
Bilirubin, Direct: 0.2 mg/dL (ref 0.0–0.2)
Indirect Bilirubin: 0.6 mg/dL (calc) (ref 0.2–1.2)
Total Bilirubin: 0.8 mg/dL (ref 0.2–1.2)

## 2021-08-05 LAB — GAMMA GT: GGT: 18 U/L (ref 3–70)

## 2021-08-05 LAB — CBC
HCT: 47.4 % (ref 38.5–50.0)
Hemoglobin: 16.2 g/dL (ref 13.2–17.1)
MCH: 30.9 pg (ref 27.0–33.0)
MCHC: 34.2 g/dL (ref 32.0–36.0)
MCV: 90.5 fL (ref 80.0–100.0)
MPV: 11.2 fL (ref 7.5–12.5)
Platelets: 225 10*3/uL (ref 140–400)
RBC: 5.24 10*6/uL (ref 4.20–5.80)
RDW: 12.1 % (ref 11.0–15.0)
WBC: 7.1 10*3/uL (ref 3.8–10.8)

## 2021-08-05 LAB — COMPLETE METABOLIC PANEL WITH GFR
AG Ratio: 1.8 (calc) (ref 1.0–2.5)
ALT: 18 U/L (ref 9–46)
AST: 12 U/L (ref 10–35)
Albumin: 4.4 g/dL (ref 3.6–5.1)
Alkaline phosphatase (APISO): 56 U/L (ref 35–144)
BUN: 15 mg/dL (ref 7–25)
CO2: 30 mmol/L (ref 20–32)
Calcium: 9.7 mg/dL (ref 8.6–10.3)
Chloride: 105 mmol/L (ref 98–110)
Creat: 0.81 mg/dL (ref 0.70–1.35)
Globulin: 2.4 g/dL (calc) (ref 1.9–3.7)
Glucose, Bld: 97 mg/dL (ref 65–139)
Potassium: 4.4 mmol/L (ref 3.5–5.3)
Sodium: 141 mmol/L (ref 135–146)
Total Bilirubin: 0.8 mg/dL (ref 0.2–1.2)
Total Protein: 6.8 g/dL (ref 6.1–8.1)
eGFR: 98 mL/min/{1.73_m2} (ref >=60)

## 2021-08-05 LAB — LACTATE DEHYDROGENASE: LDH: 114 U/L — ABNORMAL LOW (ref 120–250)

## 2021-08-05 LAB — PATHOLOGIST SMEAR REVIEW

## 2021-08-06 NOTE — Progress Notes (Signed)
Louay,   Kidney, liver, glucose look great. Bilirubin normalized.  Normal CBC.  Normal hemoglobin.  LDH a little on the low side not concerning.

## 2021-08-18 ENCOUNTER — Other Ambulatory Visit: Payer: Self-pay | Admitting: Physician Assistant

## 2021-08-18 DIAGNOSIS — G479 Sleep disorder, unspecified: Secondary | ICD-10-CM

## 2021-08-29 NOTE — Patient Instructions (Signed)
DUE TO COVID-19 ONLY ONE VISITOR IS ALLOWED TO COME WITH YOU AND STAY IN THE WAITING ROOM ONLY DURING PRE OP AND PROCEDURE DAY OF SURGERY.   Up to two visitors ages 16+ are allowed at one time in a patient's room.  The visitors may rotate out with other people throughout the day.  Additionally, up to two children between the ages of 37 and 69 are allowed and do not count toward the number of allowed visitors.  Children within this age range must be accompanied by an adult visitor.  One adult visitor may remain with the patient overnight and must be in the room by 8 PM.           Your procedure is scheduled on: 09-09-21   Report to [facility] Main  Entrance   Report to admitting at       1215  PM     Call this number if you have problems the morning of surgery [phone]   Remember: Do not eat food :After Midnight. You may have clear liquids until 1130 am then nothing by mouth    CLEAR LIQUID DIET                                                                    water Black Coffee and tea, regular and decaf No Creamer                            Plain Jell-O any favor except red or purple                                  Fruit ices (not with fruit pulp)                                      Iced Popsicles                                     Carbonated beverages, regular and diet                                    Cranberry, grape and apple juices Sports drinks like Gatorade Lightly seasoned clear broth or consume(fat free) Sugar, honey syrup    _____________________________________________________________________     BRUSH YOUR TEETH MORNING OF SURGERY AND RINSE YOUR MOUTH OUT, NO CHEWING GUM CANDY OR MINTS.     Take these medicines the morning of surgery with A SIP OF WATER: flonase, amlodipine                                 You may not have any metal on your body including hair pins and              piercings  Do not wear jewelry, make-up, lotions,  powders,perfumes,  deodorant                        Men may shave face and neck.   Do not bring valuables to the hospital. [place].  Contacts, dentures or bridgework may not be worn into surgery.       Patients discharged the day of surgery will not be allowed to drive home. IF YOU ARE HAVING SURGERY AND GOING HOME THE SAME DAY, YOU MUST HAVE AN ADULT TO DRIVE YOU HOME AND BE WITH YOU FOR 24 HOURS. YOU MAY GO HOME BY TAXI OR UBER OR ORTHERWISE, BUT AN ADULT MUST ACCOMPANY YOU HOME AND STAY WITH YOU FOR 24 HOURS.  Name and phone number of your driver:  Special Instructions: N/A              Please read over the following fact sheets you were given: _____________________________________________________________________             [facility] - Preparing for Surgery Before surgery, you can play an important role.  Because skin is not sterile, your skin needs to be as free of germs as possible.  You can reduce the number of germs on your skin by washing with CHG (chlorahexidine gluconate) soap before surgery.  CHG is an antiseptic cleaner which kills germs and bonds with the skin to continue killing germs even after washing. Please DO NOT use if you have an allergy to CHG or antibacterial soaps.  If your skin becomes reddened/irritated stop using the CHG and inform your nurse when you arrive at Short Stay. Do not shave (including legs and underarms) for at least 48 hours prior to the first CHG shower.  You may shave your face/neck. Please follow these instructions carefully:  1.  Shower with CHG Soap the night before surgery and the  morning of Surgery.  2.  If you choose to wash your hair, wash your hair first as usual with your  normal  shampoo.  3.  After you shampoo, rinse your hair and body thoroughly to remove the  shampoo.                           4.  Use CHG as you would any other liquid soap.  You can apply chg directly  to the  skin and wash                       Gently with a scrungie or clean washcloth.  5.  Apply the CHG Soap to your body ONLY FROM THE NECK DOWN.   Do not use on face/ open                           Wound or open sores. Avoid contact with eyes, ears mouth and genitals (private parts).                       Wash face,  Genitals (private parts) with your normal soap.             6.  Wash thoroughly, paying special attention to the area where your surgery  will be performed.  7.  Thoroughly rinse your body with warm water from the neck down.  8.  DO NOT shower/wash with your normal soap after using and rinsing off  the CHG Soap.                9.  Pat yourself dry with a clean towel.            10.  Wear clean pajamas.            11.  Place clean sheets on your bed the night of your first shower and do not  sleep with pets. Day of Surgery : Do not apply any lotions/deodorants the morning of surgery.  Please wear clean clothes to the hospital/surgery center.  FAILURE TO FOLLOW THESE INSTRUCTIONS MAY RESULT IN THE CANCELLATION OF YOUR SURGERY PATIENT SIGNATURE_________________________________  NURSE SIGNATURE__________________________________  ________________________________________________________________________

## 2021-08-29 NOTE — Progress Notes (Signed)
Please place orders in epic pt. Is scheduled for preop 

## 2021-08-29 NOTE — Progress Notes (Addendum)
PCP - Avon Gully  Cardiologist - no  PPM/ICD -  Device Orders -  Rep Notified -   Chest x-ray -  EKG -  Stress Test -  ECHO -  Cardiac Cath -   Sleep Study -  CPAP -   Fasting Blood Sugar -  Checks Blood Sugar _____ times a day  Blood Thinner Instructions: Aspirin Instructions:  ERAS Protcol - PRE-SURGERY Ensure or G2-   COVID TEST- N/A COVID vaccine -pfizer x3  Activity--Able to walk a flight of stairs without SOB Anesthesia review:   Patient denies shortness of breath, fever, cough and chest pain at PAT appointment   All instructions explained to the patient, with a verbal understanding of the material. Patient agrees to go over the instructions while at home for a better understanding. Patient also instructed to self quarantine after being tested for COVID-19. The opportunity to ask questions was provided.

## 2021-09-01 ENCOUNTER — Other Ambulatory Visit: Payer: Self-pay

## 2021-09-01 ENCOUNTER — Encounter (HOSPITAL_COMMUNITY): Payer: Self-pay

## 2021-09-01 ENCOUNTER — Encounter (HOSPITAL_COMMUNITY)
Admission: RE | Admit: 2021-09-01 | Discharge: 2021-09-01 | Disposition: A | Discharge status: Home / Self Care | Payer: BC Managed Care – PPO | Source: Ambulatory Visit | Attending: General Surgery | Admitting: General Surgery

## 2021-09-01 VITALS — BP 122/82 | HR 62 | Temp 97.9°F | Resp 16 | Ht 71.0 in | Wt 175.0 lb

## 2021-09-01 DIAGNOSIS — Z01818 Encounter for other preprocedural examination: Secondary | ICD-10-CM | POA: Insufficient documentation

## 2021-09-01 DIAGNOSIS — I10 Essential (primary) hypertension: Secondary | ICD-10-CM | POA: Diagnosis not present

## 2021-09-01 LAB — BASIC METABOLIC PANEL
Anion gap: 4 — ABNORMAL LOW (ref 5–15)
BUN: 13 mg/dL (ref 8–23)
CO2: 27 mmol/L (ref 22–32)
Calcium: 9.1 mg/dL (ref 8.9–10.3)
Chloride: 106 mmol/L (ref 98–111)
Creatinine, Ser: 0.74 mg/dL (ref 0.61–1.24)
GFR, Estimated: >60 mL/min (ref >60)
Glucose, Bld: 109 mg/dL — ABNORMAL HIGH (ref 70–99)
Potassium: 4.6 mmol/L (ref 3.5–5.1)
Sodium: 137 mmol/L (ref 135–145)

## 2021-09-01 LAB — CBC
HCT: 47.5 % (ref 39.0–52.0)
Hemoglobin: 16.5 g/dL (ref 13.0–17.0)
MCH: 31.5 pg (ref 26.0–34.0)
MCHC: 34.7 g/dL (ref 30.0–36.0)
MCV: 90.8 fL (ref 80.0–100.0)
Platelets: 211 10*3/uL (ref 150–400)
RBC: 5.23 MIL/uL (ref 4.22–5.81)
RDW: 13 % (ref 11.5–15.5)
WBC: 7.9 10*3/uL (ref 4.0–10.5)
nRBC: 0 % (ref 0.0–0.2)

## 2021-09-08 ENCOUNTER — Ambulatory Visit: Payer: Self-pay | Admitting: General Surgery

## 2021-09-08 NOTE — Progress Notes (Signed)
Called [facility] for orders for pt for his 09/09/21 inguinal hernia repair surgery with Dr Redmond Pulling. Triage desk to send Dr Redmond Pulling a note about orders.

## 2021-09-09 ENCOUNTER — Ambulatory Visit (HOSPITAL_COMMUNITY): Payer: BC Managed Care – PPO | Admitting: Certified Registered"

## 2021-09-09 ENCOUNTER — Encounter (HOSPITAL_COMMUNITY): Payer: Self-pay | Admitting: General Surgery

## 2021-09-09 ENCOUNTER — Encounter (HOSPITAL_COMMUNITY)
Admission: RE | Disposition: A | Discharge status: Home / Self Care | Payer: Self-pay | Source: Ambulatory Visit | Attending: General Surgery

## 2021-09-09 ENCOUNTER — Ambulatory Visit (HOSPITAL_COMMUNITY)
Admission: RE | Admit: 2021-09-09 | Discharge: 2021-09-09 | Disposition: A | Discharge status: Home / Self Care | Payer: BC Managed Care – PPO | Source: Ambulatory Visit | Attending: General Surgery | Admitting: General Surgery

## 2021-09-09 DIAGNOSIS — Z87891 Personal history of nicotine dependence: Secondary | ICD-10-CM | POA: Insufficient documentation

## 2021-09-09 DIAGNOSIS — D176 Benign lipomatous neoplasm of spermatic cord: Secondary | ICD-10-CM | POA: Insufficient documentation

## 2021-09-09 DIAGNOSIS — K4090 Unilateral inguinal hernia, without obstruction or gangrene, not specified as recurrent: Secondary | ICD-10-CM | POA: Diagnosis present

## 2021-09-09 DIAGNOSIS — Z8546 Personal history of malignant neoplasm of prostate: Secondary | ICD-10-CM | POA: Insufficient documentation

## 2021-09-09 DIAGNOSIS — R1030 Lower abdominal pain, unspecified: Secondary | ICD-10-CM | POA: Insufficient documentation

## 2021-09-09 HISTORY — PX: INGUINAL HERNIA REPAIR: SHX194

## 2021-09-09 SURGERY — REPAIR, HERNIA, INGUINAL, LAPAROSCOPIC
Anesthesia: General | Site: Abdomen | Laterality: Left

## 2021-09-09 MED ORDER — CHLORHEXIDINE GLUCONATE CLOTH 2 % EX PADS: 6.0000 | MEDICATED_PAD | Freq: Once | CUTANEOUS | Status: DC

## 2021-09-09 MED ORDER — SODIUM CHLORIDE (PF) 0.9 % IJ SOLN
INTRAMUSCULAR | Status: DC | PRN
  Administered 2021-09-09: 50 mL

## 2021-09-09 MED ORDER — 0.9 % SODIUM CHLORIDE (POUR BTL) OPTIME
TOPICAL | Status: DC | PRN
  Administered 2021-09-09: 1000 mL

## 2021-09-09 MED ORDER — ROCURONIUM BROMIDE 10 MG/ML (PF) SYRINGE
PREFILLED_SYRINGE | INTRAVENOUS | Status: DC | PRN
  Administered 2021-09-09 (×2): 10 mg via INTRAVENOUS
  Administered 2021-09-09: 60 mg via INTRAVENOUS

## 2021-09-09 MED ORDER — LACTATED RINGERS IV SOLN: INTRAVENOUS | Status: DC

## 2021-09-09 MED ORDER — DEXAMETHASONE SODIUM PHOSPHATE 10 MG/ML IJ SOLN
INTRAMUSCULAR | Status: DC | PRN
  Administered 2021-09-09: 8 mg via INTRAVENOUS

## 2021-09-09 MED ORDER — ONDANSETRON HCL 4 MG/2ML IJ SOLN
INTRAMUSCULAR | Status: DC | PRN
  Administered 2021-09-09: 4 mg via INTRAVENOUS

## 2021-09-09 MED ORDER — CHLORHEXIDINE GLUCONATE 0.12 % MT SOLN
15.0000 mL | Freq: Once | OROMUCOSAL | Status: AC
  Administered 2021-09-09: 15 mL via OROMUCOSAL

## 2021-09-09 MED ORDER — PROPOFOL 10 MG/ML IV BOLUS
INTRAVENOUS | Status: DC | PRN
  Administered 2021-09-09: 150 mg via INTRAVENOUS

## 2021-09-09 MED ORDER — FENTANYL CITRATE (PF) 100 MCG/2ML IJ SOLN
INTRAMUSCULAR | Status: DC | PRN
  Administered 2021-09-09: 50 ug via INTRAVENOUS
  Administered 2021-09-09: 100 ug via INTRAVENOUS
  Administered 2021-09-09: 50 ug via INTRAVENOUS

## 2021-09-09 MED ORDER — LIDOCAINE 2% (20 MG/ML) 5 ML SYRINGE
INTRAMUSCULAR | Status: DC | PRN
  Administered 2021-09-09: 60 mg via INTRAVENOUS

## 2021-09-09 MED ORDER — KETAMINE HCL 10 MG/ML IJ SOLN
INTRAMUSCULAR | Status: AC
  Filled 2021-09-09: qty 1

## 2021-09-09 MED ORDER — KETAMINE HCL 10 MG/ML IJ SOLN
INTRAMUSCULAR | Status: DC | PRN
  Administered 2021-09-09: 30 mg via INTRAVENOUS

## 2021-09-09 MED ORDER — CEFAZOLIN SODIUM-DEXTROSE 2-4 GM/100ML-% IV SOLN
INTRAVENOUS | Status: AC
  Filled 2021-09-09: qty 100

## 2021-09-09 MED ORDER — BUPIVACAINE LIPOSOME 1.3 % IJ SUSP
INTRAMUSCULAR | Status: AC
  Filled 2021-09-09: qty 20

## 2021-09-09 MED ORDER — ACETAMINOPHEN 500 MG PO TABS
1000.0000 mg | ORAL_TABLET | ORAL | Status: AC
  Administered 2021-09-09: 1000 mg via ORAL

## 2021-09-09 MED ORDER — PHENYLEPHRINE HCL-NACL 20-0.9 MG/250ML-% IV SOLN
INTRAVENOUS | Status: AC
  Filled 2021-09-09: qty 250

## 2021-09-09 MED ORDER — LIDOCAINE HCL (PF) 2 % IJ SOLN
INTRAMUSCULAR | Status: DC | PRN
  Administered 2021-09-09: 1.5 mg/kg/h via INTRADERMAL

## 2021-09-09 MED ORDER — LACTATED RINGERS IR SOLN
Status: DC | PRN
  Administered 2021-09-09: 1000 mL

## 2021-09-09 MED ORDER — KETOROLAC TROMETHAMINE 15 MG/ML IJ SOLN
INTRAMUSCULAR | Status: DC | PRN
  Administered 2021-09-09: 15 mg via INTRAVENOUS

## 2021-09-09 MED ORDER — ACETAMINOPHEN 500 MG PO TABS: 1000.0000 mg | ORAL_TABLET | Freq: Three times a day (TID) | ORAL | 0 refills | Status: AC

## 2021-09-09 MED ORDER — BUPIVACAINE LIPOSOME 1.3 % IJ SUSP
INTRAMUSCULAR | Status: DC | PRN
  Administered 2021-09-09: 20 mL

## 2021-09-09 MED ORDER — OXYCODONE HCL 5 MG PO TABS: 5.0000 mg | ORAL_TABLET | Freq: Four times a day (QID) | ORAL | 0 refills | Status: DC | PRN

## 2021-09-09 MED ORDER — MIDAZOLAM HCL 2 MG/2ML IJ SOLN
INTRAMUSCULAR | Status: AC
  Filled 2021-09-09: qty 2

## 2021-09-09 MED ORDER — ACETAMINOPHEN 500 MG PO TABS
ORAL_TABLET | ORAL | Status: AC
  Filled 2021-09-09: qty 2

## 2021-09-09 MED ORDER — OXYCODONE HCL 5 MG PO TABS
5.0000 mg | ORAL_TABLET | Freq: Once | ORAL | Status: AC
  Administered 2021-09-09: 5 mg via ORAL

## 2021-09-09 MED ORDER — PHENYLEPHRINE HCL-NACL 20-0.9 MG/250ML-% IV SOLN
INTRAVENOUS | Status: DC | PRN
  Administered 2021-09-09: 30 ug/min via INTRAVENOUS

## 2021-09-09 MED ORDER — CEFAZOLIN SODIUM-DEXTROSE 2-4 GM/100ML-% IV SOLN
2.0000 g | INTRAVENOUS | Status: AC
  Administered 2021-09-09: 2 g via INTRAVENOUS

## 2021-09-09 MED ORDER — FENTANYL CITRATE (PF) 250 MCG/5ML IJ SOLN
INTRAMUSCULAR | Status: AC
  Filled 2021-09-09: qty 5

## 2021-09-09 MED ORDER — SUGAMMADEX SODIUM 200 MG/2ML IV SOLN
INTRAVENOUS | Status: DC | PRN
  Administered 2021-09-09: 200 mg via INTRAVENOUS

## 2021-09-09 MED ORDER — GLYCOPYRROLATE 0.2 MG/ML IJ SOLN
INTRAMUSCULAR | Status: DC | PRN
  Administered 2021-09-09: .2 mg via INTRAVENOUS

## 2021-09-09 MED ORDER — FENTANYL CITRATE PF 50 MCG/ML IJ SOSY
25.0000 ug | PREFILLED_SYRINGE | INTRAMUSCULAR | Status: DC | PRN
  Administered 2021-09-09: 50 ug via INTRAVENOUS

## 2021-09-09 MED ORDER — FENTANYL CITRATE PF 50 MCG/ML IJ SOSY
PREFILLED_SYRINGE | INTRAMUSCULAR | Status: AC
  Filled 2021-09-09: qty 1

## 2021-09-09 MED ORDER — OXYCODONE HCL 5 MG PO TABS
ORAL_TABLET | ORAL | Status: AC
  Filled 2021-09-09: qty 1

## 2021-09-09 MED ORDER — PROMETHAZINE HCL 25 MG/ML IJ SOLN: 6.2500 mg | INTRAMUSCULAR | Status: DC | PRN

## 2021-09-09 MED ORDER — MIDAZOLAM HCL 5 MG/5ML IJ SOLN
INTRAMUSCULAR | Status: DC | PRN
  Administered 2021-09-09: 2 mg via INTRAVENOUS

## 2021-09-09 MED ORDER — ORAL CARE MOUTH RINSE: 15.0000 mL | Freq: Once | OROMUCOSAL | Status: AC

## 2021-09-09 SURGICAL SUPPLY — 44 items
APPLIER CLIP 5 13 M/L LIGAMAX5 (MISCELLANEOUS)
BAG COUNTER SPONGE SURGICOUNT (BAG) IMPLANT
BAG SURGICOUNT SPONGE COUNTING (BAG)
BENZOIN TINCTURE PRP APPL 2/3 (GAUZE/BANDAGES/DRESSINGS) ×3 IMPLANT
BNDG ADH 1X3 SHEER STRL LF (GAUZE/BANDAGES/DRESSINGS) IMPLANT
CABLE HIGH FREQUENCY MONO STRZ (ELECTRODE) ×3 IMPLANT
CHLORAPREP W/TINT 26 (MISCELLANEOUS) ×3 IMPLANT
CLIP APPLIE 5 13 M/L LIGAMAX5 (MISCELLANEOUS) IMPLANT
CLOSURE WOUND 1/2 X4 (GAUZE/BANDAGES/DRESSINGS) ×1
COVER SURGICAL LIGHT HANDLE (MISCELLANEOUS) ×3 IMPLANT
DECANTER SPIKE VIAL GLASS SM (MISCELLANEOUS) ×3 IMPLANT
DERMABOND ADVANCED (GAUZE/BANDAGES/DRESSINGS) ×2
DERMABOND ADVANCED .7 DNX12 (GAUZE/BANDAGES/DRESSINGS) ×1 IMPLANT
DEVICE SECURE STRAP 25 ABSORB (INSTRUMENTS) ×3 IMPLANT
DRSG TEGADERM 2-3/8X2-3/4 SM (GAUZE/BANDAGES/DRESSINGS) IMPLANT
DRSG TEGADERM 4X4.75 (GAUZE/BANDAGES/DRESSINGS) IMPLANT
ELECT REM PT RETURN 15FT ADLT (MISCELLANEOUS) ×3 IMPLANT
GAUZE SPONGE 2X2 8PLY STRL LF (GAUZE/BANDAGES/DRESSINGS) IMPLANT
GLOVE SRG 8 PF TXTR STRL LF DI (GLOVE) ×1 IMPLANT
GLOVE SURG POLY ORTHO LF SZ7.5 (GLOVE) ×3 IMPLANT
GLOVE SURG UNDER POLY LF SZ8 (GLOVE) ×2
GOWN STRL REUS W/TWL XL LVL3 (GOWN DISPOSABLE) ×9 IMPLANT
GRASPER SUT TROCAR 14GX15 (MISCELLANEOUS) ×3 IMPLANT
IRRIG SUCT STRYKERFLOW 2 WTIP (MISCELLANEOUS)
IRRIGATION SUCT STRKRFLW 2 WTP (MISCELLANEOUS) IMPLANT
KIT BASIN OR (CUSTOM PROCEDURE TRAY) ×3 IMPLANT
KIT TURNOVER KIT A (KITS) ×3 IMPLANT
L-HOOK LAP DISP 36CM (ELECTROSURGICAL)
LHOOK LAP DISP 36CM (ELECTROSURGICAL) IMPLANT
MESH 3DMAX 4X6 LT LRG (Mesh General) ×3 IMPLANT
PAD POSITIONING PINK XL (MISCELLANEOUS) ×3 IMPLANT
SCISSORS LAP 5X35 DISP (ENDOMECHANICALS) ×3 IMPLANT
SET TUBE SMOKE EVAC HIGH FLOW (TUBING) IMPLANT
SLEEVE XCEL OPT CAN 5 100 (ENDOMECHANICALS) ×3 IMPLANT
SPONGE GAUZE 2X2 STER 10/PKG (GAUZE/BANDAGES/DRESSINGS)
STRIP CLOSURE SKIN 1/2X4 (GAUZE/BANDAGES/DRESSINGS) ×2 IMPLANT
SUT MNCRL AB 4-0 PS2 18 (SUTURE) ×3 IMPLANT
SUT NOVA NAB DX-16 0-1 5-0 T12 (SUTURE) IMPLANT
SUT NOVA NAB GS-21 0 18 T12 DT (SUTURE) IMPLANT
TOWEL OR 17X26 10 PK STRL BLUE (TOWEL DISPOSABLE) ×3 IMPLANT
TOWEL OR NON WOVEN STRL DISP B (DISPOSABLE) ×3 IMPLANT
TRAY LAPAROSCOPIC (CUSTOM PROCEDURE TRAY) ×3 IMPLANT
TROCAR BLADELESS OPT 5 100 (ENDOMECHANICALS) ×3 IMPLANT
TROCAR XCEL BLUNT TIP 100MML (ENDOMECHANICALS) ×3 IMPLANT

## 2021-09-09 NOTE — Discharge Instructions (Signed)
[facility]: POST OP INSTRUCTIONS Always review your discharge instruction sheet given to you by the facility where your surgery was performed. IF YOU HAVE DISABILITY OR FAMILY LEAVE FORMS, YOU MUST BRING THEM TO THE OFFICE FOR PROCESSING.   DO NOT GIVE THEM TO YOUR DOCTOR.  PAIN CONTROL  First take acetaminophen (Tylenol) AND/or ibuprofen (Advil) to control your pain after surgery.  Follow directions on package.  Taking acetaminophen (Tylenol) and/or ibuprofen (Advil) regularly after surgery will help to control your pain and lower the amount of prescription pain medication you may need.  You should not take more than 3,000 mg (3 grams) of acetaminophen (Tylenol) in 24 hours.  You should not take ibuprofen (Advil), aleve, motrin, naprosyn or other NSAIDS if you have a history of stomach ulcers or chronic kidney disease.  A prescription for pain medication may be given to you upon discharge.  Take your pain medication as prescribed, if you still have uncontrolled pain after taking acetaminophen (Tylenol) or ibuprofen (Advil). Use ice packs to help control pain. If you need a refill on your pain medication, please contact your pharmacy.  They will contact our office to request authorization. Prescriptions will not be filled after 5pm or on week-ends.  HOME MEDICATIONS Take your usually prescribed medications unless otherwise directed.  DIET You should follow a light diet the first few days after arrival home.  Be sure to include lots of fluids daily. Avoid fatty, fried foods.   CONSTIPATION It is common to experience some constipation after surgery and if you are taking pain medication.  Increasing fluid intake and taking a stool softener (such as Colace) will usually help or prevent this problem from occurring.  A mild laxative (Milk of Magnesia or Miralax) should be taken according to package instructions if there are no bowel movements after 48  hours.  WOUND/INCISION CARE Most patients will experience some swelling and bruising in the area of the incisions.  Ice packs will help.  Swelling and bruising can take several days to resolve.  Unless discharge instructions indicate otherwise, follow guidelines below  STERI-STRIPS - you may remove your outer bandages 48 hours after surgery, and you may shower at that time.  You have steri-strips (small skin tapes) in place directly over the incision.  These strips should be left on the skin for 7-10 days.   DERMABOND/SKIN GLUE - you may shower in 24 hours.  The glue will flake off over the next 2-3 weeks. Any sutures or staples will be removed at the office during your follow-up visit.  ACTIVITIES You may resume regular (light) daily activities beginning the next day--such as daily self-care, walking, climbing stairs--gradually increasing activities as tolerated.  You may have sexual intercourse when it is comfortable.  Refrain from any heavy lifting or straining until approved by your doctor. You may drive when you are no longer taking prescription pain medication, you can comfortably wear a seatbelt, and you can safely maneuver your car and apply brakes.  FOLLOW-UP You should see your doctor in the office for a follow-up appointment approximately 2-3 weeks after your surgery.  You should have been given your post-op/follow-up appointment when your surgery was scheduled.  If you did not receive a post-op/follow-up appointment, make sure that you call for this appointment within a day or two after you arrive home to insure a convenient appointment time.  OTHER INSTRUCTIONS Do not lift/push/pull anything greater than 10lbs for 4 weeks  WHEN TO CALL  YOUR DOCTOR: Fever over 101.0 Inability to urinate Continued bleeding from incision. Increased pain, redness, or drainage from the incision. Increasing abdominal pain  The clinic staff is available to answer your questions during regular business  hours.  Please don't hesitate to call and ask to speak to one of the nurses for clinical concerns.  If you have a medical emergency, go to the nearest emergency room or call 911.  A surgeon from [facility] is always on call at the hospital. [address], [place], [place], [place]  [address] ? [address], [place], [place]   [address] [phone] ? [phone] ? FAX [phone] Web site: [email or web address]

## 2021-09-09 NOTE — Anesthesia Preprocedure Evaluation (Addendum)
Anesthesia Evaluation  Patient identified by MRN, date of birth, ID band Patient awake    Reviewed: Allergy & Precautions, NPO status , Patient's Chart, lab work & pertinent test results  Airway Mallampati: I  TM Distance: >3 FB Neck ROM: Full    Dental  (+) Dental Advisory Given, Partial Lower, Partial Upper   Pulmonary former smoker,    Pulmonary exam normal breath sounds clear to auscultation       Cardiovascular hypertension, Pt. 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  Reproductive/Obstetrics                            Anesthesia Physical Anesthesia Plan  ASA: 2  Anesthesia Plan: General   Post-op Pain Management:    Induction: Intravenous  PONV Risk Score and Plan: 3 and Midazolam, Dexamethasone and Ondansetron  Airway Management Planned: Oral ETT  Additional Equipment:   Intra-op Plan:   Post-operative Plan: Extubation in OR  Informed Consent: I have reviewed the patients History and Physical, chart, labs and discussed the procedure including the risks, benefits and alternatives for the proposed anesthesia with the patient or authorized representative who has indicated his/her understanding and acceptance.     Dental advisory given  Plan Discussed with: CRNA  Anesthesia Plan Comments:         Anesthesia Quick Evaluation

## 2021-09-09 NOTE — Anesthesia Procedure Notes (Signed)
Procedure Name: Intubation Date/Time: 09/09/2021 12:24 PM Performed by: Cleda Daub, CRNA Pre-anesthesia Checklist: Patient identified, Emergency Drugs available, Suction available and Patient being monitored Patient Re-evaluated:Patient Re-evaluated prior to induction Oxygen Delivery Method: Circle system utilized Preoxygenation: Pre-oxygenation with 100% oxygen Induction Type: IV induction Ventilation: Mask ventilation without difficulty Laryngoscope Size: Mac and 4 Grade View: Grade I Tube type: Oral Tube size: 7.5 mm Number of attempts: 1 Airway Equipment and Method: Stylet and Oral airway Placement Confirmation: ETT inserted through vocal cords under direct vision, positive ETCO2 and breath sounds checked- equal and bilateral Secured at: 22 cm Tube secured with: Tape Dental Injury: Teeth and Oropharynx as per pre-operative assessment

## 2021-09-09 NOTE — H&P (Signed)
DATE OF ENCOUNTER: 08/27/2021  Subjective   Chief Complaint: Hernia   History of Present Illness: Jack Grimes is a 65 y.o. male who is seen today as an office consultation at the request of Iran Planas PA for evaluation of left inguinal hernia.   65 year old male with a history of hypertension, prostate cancer, colon polyps, G AD presented to his PCP office at the end of October complaining of a left inguinal bulge for several weeks. He complained of some pulling sensation in the groin area. He has a history of a right inguinal hernia 40 years ago. He was treated with radiation for prostate cancer several years ago. His urologist is Dr. Alinda Money.  He states that he has noticed a bulge on the left side. It comes and goes. He denies any burning, shooting or stabbing pain. Denies chest pain, chest pressure, shortness of breath, dyspnea on exertion  Review of Systems: A complete review of systems was obtained from the patient. I have reviewed this information and discussed as appropriate with the patient. See HPI as well for other ROS.  ROS   Medical History: Past Medical History:  Diagnosis Date   Anxiety   Colon polyps   Hypertension   Prostate cancer (CMS-HCC)   There is no problem list on file for this patient.  Past Surgical History:  Procedure Laterality Date   HERNIA REPAIR    Allergies  Allergen Reactions   Sulfa (Sulfonamide Antibiotics) Hives and Itching   Current Outpatient Medications on File Prior to Visit  Medication Sig Dispense Refill   amLODIPine (NORVASC) 10 MG tablet TAKE 1 TABLET BY MOUTH EVERY DAY/   esomeprazole (NEXIUM) 40 MG DR capsule Take one capsule by mouth daily at 12 noon   meloxicam (MOBIC) 15 MG tablet Take 1 tablet (15 mg total) by mouth once daily   No current facility-administered medications on file prior to visit.   History reviewed. No pertinent family history.   Social History   Tobacco Use  Smoking Status Former   Types:  Cigarettes  Smokeless Tobacco Never    Social History   Socioeconomic History   Marital status: Married  Tobacco Use   Smoking status: Former  Types: Cigarettes   Smokeless tobacco: Never  Vaping Use   Vaping Use: Never used  Substance and Sexual Activity   Alcohol use: Yes   Drug use: Not Currently   Objective:   Vitals:  08/27/21 0956  BP: 132/76  Pulse: 77  Temp: 36.8 C (98.2 F)  SpO2: 98%  Weight: 84.8 kg (187 lb)  Height: 180.3 cm (5\' 11" )   Body mass index is 26.08 kg/m.  Constitutional: NAD; conversant; no deformities Eyes: Moist conjunctiva; no lid lag; anicteric; PERRL Neck: Trachea midline; no thyromegaly Lungs: Normal respiratory effort; no tactile fremitus CV: RRR; no palpable thrills; no pitting edema GI: Abd soft, nontender, nondistended; no palpable hepatosplenomegaly GU: Well-healed right inguinal incision. Asymmetry in the inguinal region with the left side more prominent. Subtle bulge with Valsalva on deep palpation of the inguinal ring MSK: Normal gait; no clubbing/cyanosis Psychiatric: Appropriate affect; alert and oriented x3 Lymphatic: No palpable cervical or axillary lymphadenopathy Skin: No rash, lesions or jaundice  Labs, Imaging and Diagnostic Testing: Reviewed referring provider's note. Reviewed labs from October 24.  Assessment and Plan:  Diagnoses and all orders for this visit:  Non-recurrent unilateral inguinal hernia without obstruction or gangrene  Hypertension, essential  History of prostate cancer    We discussed the etiology  of inguinal hernias. We discussed the signs & symptoms of incarceration & strangulation. We discussed non-operative and operative management. We discussed open and minimally invasive approaches (laparoscopic/robotic) extensively.   I described each of the approaches in detail. I explained how they differed. The patient was given educational material for both lap and open. We discussed the risks and  benefits including but not limited to bleeding, infection, chronic inguinal pain, nerve entrapment, hernia recurrence, mesh complications, hematoma formation, urinary retention, injury to the testicles, numbness in the groin, blood clots, injury to the surrounding structures, and anesthesia risk. We also discussed the typical post operative recovery course, including no heavy lifting for 4 weeks. I explained that the likelihood of improvement of their symptoms is good  The patient has elected to proceed with laparoscopic repair of left inguinal hernia with mesh  This patient encounter took 45 minutes on the actual day of his visit perform the following: take history, perform exam, review outside records, interpret imaging, counsel the patient on their diagnosis and answer all of his questions.   No follow-ups on file.  Alliana Mcauliff Leanne Chang, MD  General, Minimally Invasive, & Bariatric Surgery

## 2021-09-09 NOTE — Transfer of Care (Signed)
Immediate Anesthesia Transfer of Care Note  Patient: TRENDON ZARING  Procedure(s) Performed: LAPAROSCOPIC LEFT  INGUINAL HERNIA REPAIR WITH MESH (Left: Abdomen)  Patient Location: PACU  Anesthesia Type:General  Level of Consciousness: awake, alert , oriented and patient cooperative  Airway & Oxygen Therapy: Patient Spontanous Breathing and Patient connected to face mask oxygen  Post-op Assessment: Report given to RN and Post -op Vital signs reviewed and stable  Post vital signs: Reviewed and stable  Last Vitals:  Vitals Value Taken Time  BP 103/73 09/09/21 1406  Temp    Pulse 54 09/09/21 1413  Resp 14 09/09/21 1413  SpO2 99 % 09/09/21 1413  Vitals shown include unvalidated device data.  Last Pain:  Vitals:   09/09/21 1123  TempSrc: Oral         Complications: No notable events documented.

## 2021-09-09 NOTE — Op Note (Signed)
09/09/2021  Jack Grimes 28-Oct-1955   PREOPERATIVE DIAGNOSIS: left inguinal hernia.   POSTOPERATIVE DIAGNOSIS: left indirect inguinal hernia.   PROCEDURE: Laparoscopic repair of left indirect inguinal hernia with  mesh (TAPP). Laparoscopic bilateral TAP block  SURGEON: Leighton Ruff. Redmond Pulling, MD   ASSISTANT SURGEON: None.   ANESTHESIA: General plus local consisting of 0.25% Marcaine with epi mixed with exparel.   ESTIMATED BLOOD LOSS: Minimal.   FINDINGS: The patient had a left indirect hernia with moderate lipoma.  It was repaired using a large piece of Bard 3dmax mesh  SPECIMEN: cord lipoma - discarded  INDICATIONS FOR PROCEDURE: 65yo male presented repair of symptomatic left inguinal hernia.  The risks and benefits including but not limited to bleeding, infection, chronic inguinal pain, nerve entrapment, hernia recurrence, mesh complications, hematoma formation, urinary retention, injury to the testicles or the ovaries, numbness in the groin, blood clots, injury to the surrounding structures, and anesthesia risk was discussed with the patient.  DESCRIPTION OF PROCEDURE: After obtaining verbal consent and marking  the left groin in the holding area with the patient confirming the  operative site, the patient was then taken back to the operating room, placed  supine on the operating room table. General endotracheal anesthesia was  established. The patient had emptied their bladder prior to going back to  the operating room. Sequential compression devices were placed. The  abdomen and groin were prepped and draped in the usual standard surgical  fashion with ChloraPrep. The patient received oral Tylenol as well as IV  antibiotics prior to the incision. A surgical time-out was performed.  Local was infiltrated at the base of the umbilicus.   Next, a 1-cm vertical supraumbilical incision was made with a #11 blade. The fascia  was grasped and lifted anteriorly. Next, the fascia was  incised, and  the abdominal cavity was entered. Pursestring suture was placed around  the fascial edges using a 0 Vicryl. A 12-mm Hasson trocar was placed.  Pneumoperitoneum was smoothly established up to a patient pressure of 15  mmHg. Laparoscope was advanced. There was no evidence of a  contralateral hernia. The patient had a defect lateral to  the inferior epigastric vessel, consistent with an left indirect  hernia. Two 5-mm trocars were placed, one on the right, one on the left  in the midclavicular line slightly above the level of the umbilicus all  under direct visualization. After a bilateral laparoscopic TAP block had been infiltrated for postoperative pain relief,  I then  made incision along the peritoneum on the left, starting 2 inches above  the anterior superior iliac spine and caring it medial  toward the median umbilical ligament in a lazy S configuration using  Endo Shears with electrocautery. The peritoneal flap was then gently  dissected downward from the anterior abdominal wall taking care not to  injure the inferior epigastric vessels. The pubic bone was identified. Blunt dissection continued below the level of the pubic bone. No femoral or obturator hernia.  Stayed anterior to the bladder.  The testicular vessels were identified.  Using  traction and counter traction with short graspers, I reduced the sac in  its entirety. The testicular vessels had been identified and preserved. The vas deferens was identified and preserved, and the hernia sac was stripped from those to  surrounding structures. I then went about creating a large pocket by  lifting the peritoneum of the pelvic floor. I took great care not to  injure the iliac vessels.  Local anesthetic was injected 2 finger breadths below and medial to the anterior superior iliac spine as well as along the left groin prior to placing the mesh. I then obtained a large piece of bard 3dmax mesh, placed it through the  Hasson trocar, half of it covered medial  to the inferior epigastric vessels and half of it lateral to the  inferior epigastric vessels. The defect was well  covered with the mesh. I then secured the mesh to the abdominal wall  using an Ethicon secure strap tack. Tacks were placed through  the Cooper's ligament, one tack on each side of the inferior epigastric  vessel. No tacks were placed below the  shelving edge of the inguinal ligament. Pneumoperitoneum was reduced  to 8 mmHg. I then brought the peritoneal flap back up to the abdominal  wall and tacked it to the abdominal wall using 4 tacks. There was no  defect in the peritoneum, and the mesh was well covered. I removed the  Hasson trocar and tied down the previously placed pursestring suture. I placed an additional interrupted 0 vicryl suture at the umbilical fascia using a lap PMI suture passer.  The closure was viewed laparoscopically. There was no evidence of  fascial defect. There was no air leak at the umbilicus. There was no  evidence of injury to surrounding structures. Pneumoperitoneum was  released, and the remaining trocars were removed. All skin incisions  were closed with a 4-0 Monocryl in a subcuticular fashion followed by  application of steri-strips and bandages.. All needle, instrument, and sponge counts  were correct x2. There are no immediate complications. The patient  tolerated the procedure well. The patient was extubated and taken to the  recovery room in stable condition.  Leighton Ruff. Redmond Pulling, MD, FACS General, Bariatric, & Minimally Invasive Surgery [facility], [place]

## 2021-09-09 NOTE — Interval H&P Note (Signed)
History and Physical Interval Note:  09/09/2021 12:02 PM  Jack Grimes  has presented today for surgery, with the diagnosis of LEFT INGUINAL HERNIA.  The various methods of treatment have been discussed with the patient and family. After consideration of risks, benefits and other options for treatment, the patient has consented to  Procedure(s): LAPAROSCOPIC LEFT  INGUINAL HERNIA REPAIR WITH MESH (Left) as a surgical intervention.  The patient's history has been reviewed, patient examined, no change in status, stable for surgery.  I have reviewed the patient's chart and labs.  Questions were answered to the patient's satisfaction.    Leighton Ruff. Redmond Pulling, MD, FACS General, Bariatric, & Minimally Invasive Surgery [facility] [place]  Greer Pickerel

## 2021-09-09 NOTE — Anesthesia Postprocedure Evaluation (Signed)
Anesthesia Post Note  Patient: HARPREET POMPEY  Procedure(s) Performed: LAPAROSCOPIC LEFT  INGUINAL HERNIA REPAIR WITH MESH (Left: Abdomen)     Patient location during evaluation: PACU Anesthesia Type: General Level of consciousness: awake and alert Pain management: pain level controlled Vital Signs Assessment: post-procedure vital signs reviewed and stable Respiratory status: spontaneous breathing, nonlabored ventilation, respiratory function stable and patient connected to nasal cannula oxygen Cardiovascular status: blood pressure returned to baseline and stable Postop Assessment: no apparent nausea or vomiting Anesthetic complications: no   No notable events documented.  Last Vitals:  Vitals:   09/09/21 1430 09/09/21 1445  BP: 107/78 107/72  Pulse: 64 63  Resp: 18 14  Temp:  36.5 C  SpO2: 94% 93%    Last Pain:  Vitals:   09/09/21 1450  TempSrc:   PainSc: 3                  Catalina Gravel

## 2021-09-10 ENCOUNTER — Encounter (HOSPITAL_COMMUNITY): Payer: Self-pay | Admitting: General Surgery

## 2021-09-15 ENCOUNTER — Ambulatory Visit: Payer: BC Managed Care – PPO | Admitting: Sports Medicine

## 2021-09-23 ENCOUNTER — Telehealth (INDEPENDENT_AMBULATORY_CARE_PROVIDER_SITE_OTHER): Payer: BC Managed Care – PPO | Admitting: Physician Assistant

## 2021-09-23 ENCOUNTER — Encounter: Payer: Self-pay | Admitting: Physician Assistant

## 2021-09-23 VITALS — Temp 96.3°F | Ht 71.0 in | Wt 175.0 lb

## 2021-09-23 DIAGNOSIS — U071 COVID-19: Secondary | ICD-10-CM | POA: Diagnosis not present

## 2021-09-23 DIAGNOSIS — R051 Acute cough: Secondary | ICD-10-CM

## 2021-09-23 MED ORDER — BENZONATATE 200 MG PO CAPS: 200.0000 mg | ORAL_CAPSULE | Freq: Two times a day (BID) | ORAL | 0 refills | Status: DC | PRN

## 2021-09-23 MED ORDER — ALBUTEROL SULFATE HFA 108 (90 BASE) MCG/ACT IN AERS: 2.0000 | INHALATION_SPRAY | Freq: Four times a day (QID) | RESPIRATORY_TRACT | 0 refills | Status: DC | PRN

## 2021-09-23 MED ORDER — NIRMATRELVIR/RITONAVIR (PAXLOVID)TABLET
3.0000 | ORAL_TABLET | Freq: Two times a day (BID) | ORAL | 0 refills | Status: AC
Start: 2021-09-23 — End: 2021-09-28

## 2021-09-23 NOTE — Progress Notes (Signed)
..Virtual Visit via Video Note  I connected with Jack Grimes on 09/23/2021 at  9:10 AM EST by a video enabled telemedicine application and verified that I am speaking with the correct person using two identifiers.  Location: Patient: home Provider: clinic  .Marland KitchenParticipating in visit:  Patient: Jack Grimes Provider: Iran Planas PA-C Provider in training: Danny Lawless PA-S   I discussed the limitations of evaluation and management by telemedicine and the availability of in person appointments. The patient expressed understanding and agreed to proceed.  History of Present Illness: Patient is a 65 year old male who tested positive for COVID on Monday and symptoms started on Sunday.  Patient has had 3 of his COVID vaccines.  He reports cough, fatigue, sinus pressure congestion, headache.  He is taking Tylenol Cold and sinus with some relief.  He denies any trouble breathing.     .. 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   Erectile dysfunction 01/23/2013   BPH (benign prostatic hyperplasia) 01/23/2013   Right lateral epicondylitis 02/10/2013   Right knee pain 09/25/2013   Erectile dysfunction of non-organic origin 05/11/2014   Multiple joint pain 10/31/2014   Subconjunctival hemorrhage of right eye 04/25/2015   Carpal tunnel syndrome, left 04/25/2015   Left shoulder pain 01/01/2016   Strong pulse 08/14/2016   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/29/2017   Trouble in sleeping 12/09/2020   History of melanoma 01/20/2021   Multiple atypical skin moles 01/20/2021   Snoring 01/20/2021    Elevated bilirubin 01/20/2021   Sebaceous cyst 01/20/2021   Left inguinal hernia 08/04/2021   Left cervical radiculopathy 08/04/2021   Hyperplastic colonic polyp 08/04/2021   Resolved Ambulatory Problems    Diagnosis Date Noted   No Resolved Ambulatory Problems   Past Medical History:  Diagnosis Date   Cancer ([place])    GERD (gastroesophageal reflux disease)    Hypertension    Prostate cancer ([place])     Observations/Objective: No acute distress Dry cough No labored breathing  .Marland Kitchen Today's Vitals   09/23/21 0850  Temp: (!) 96.3 F (35.7 C)  TempSrc: Oral  Weight: 175 lb (79.4 kg)  Height: 5\' 11"  (1.803 m)   Body mass index is 24.41 kg/m.    Assessment and Plan: .Marland KitchenJohn was seen today for covid positive.  Diagnoses and all orders for this visit:  COVID-19 virus infection -     nirmatrelvir/ritonavir EUA (PAXLOVID) 20 x 150 MG & 10 x 100MG  TABS; Take 3 tablets by mouth 2 (two) times daily for 5 days. (Take nirmatrelvir 150 mg two tablets twice daily for 5 days and ritonavir 100 mg one tablet twice daily for 5 days) Patient GFR is over 60. -     benzonatate (TESSALON) 200 MG capsule; Take 1 capsule (200 mg total) by mouth 2 (two) times daily as needed for cough. -     albuterol (VENTOLIN HFA) 108 (90 Base) MCG/ACT inhaler; Inhale 2 puffs into the lungs every 6 (six) hours as needed.  Acute cough -     benzonatate (TESSALON) 200 MG capsule; Take 1 capsule (200 mg total) by mouth 2 (two) times daily as needed  for cough. -     albuterol (VENTOLIN HFA) 108 (90 Base) MCG/ACT inhaler; Inhale 2 puffs into the lungs every 6 (six) hours as needed.  Positive home covid test on Day 3.  Pt is moderate risk of complications.  Started paxlovid. GFR greater than 60.  Discussed side effects. Do not take oxycodone or viagra with paxlovid.  Discussed symptomatic care.  Tessalon pearls and albuterol sent to pharmacy.  Discussed red flag symptoms and when to go to ED/UC. Quarantine until  17th, if feeling better may come out of quarantine but wear a mask until the 22nd.   Follow Up Instructions:    I discussed the assessment and treatment plan with the patient. The patient was provided an opportunity to ask questions and all were answered. The patient agreed with the plan and demonstrated an understanding of the instructions.   The patient was advised to call back or seek an in-person evaluation if the symptoms worsen or if the condition fails to improve as anticipated.    Iran Planas, PA-C

## 2021-11-22 ENCOUNTER — Other Ambulatory Visit: Payer: Self-pay | Admitting: Physician Assistant

## 2021-11-22 DIAGNOSIS — G479 Sleep disorder, unspecified: Secondary | ICD-10-CM

## 2021-11-24 NOTE — Telephone Encounter (Signed)
No longer on med list, last filled in November, last appt in October, please advise.

## 2021-12-28 ENCOUNTER — Other Ambulatory Visit: Payer: Self-pay | Admitting: Physician Assistant

## 2021-12-28 DIAGNOSIS — I10 Essential (primary) hypertension: Secondary | ICD-10-CM

## 2022-01-12 ENCOUNTER — Encounter: Payer: Self-pay | Admitting: Physician Assistant

## 2022-01-12 ENCOUNTER — Ambulatory Visit (INDEPENDENT_AMBULATORY_CARE_PROVIDER_SITE_OTHER): Payer: BC Managed Care – PPO | Admitting: Physician Assistant

## 2022-01-12 VITALS — BP 127/67 | HR 64 | Resp 16 | Ht 71.0 in | Wt 178.0 lb

## 2022-01-12 DIAGNOSIS — Z0000 Encounter for general adult medical examination without abnormal findings: Secondary | ICD-10-CM | POA: Diagnosis not present

## 2022-01-12 DIAGNOSIS — K219 Gastro-esophageal reflux disease without esophagitis: Secondary | ICD-10-CM

## 2022-01-12 DIAGNOSIS — I10 Essential (primary) hypertension: Secondary | ICD-10-CM

## 2022-01-12 DIAGNOSIS — N401 Enlarged prostate with lower urinary tract symptoms: Secondary | ICD-10-CM | POA: Diagnosis not present

## 2022-01-12 DIAGNOSIS — Z125 Encounter for screening for malignant neoplasm of prostate: Secondary | ICD-10-CM

## 2022-01-12 DIAGNOSIS — N5231 Erectile dysfunction following radical prostatectomy: Secondary | ICD-10-CM

## 2022-01-12 DIAGNOSIS — L500 Allergic urticaria: Secondary | ICD-10-CM

## 2022-01-12 DIAGNOSIS — Z13220 Encounter for screening for lipoid disorders: Secondary | ICD-10-CM

## 2022-01-12 DIAGNOSIS — G479 Sleep disorder, unspecified: Secondary | ICD-10-CM

## 2022-01-12 DIAGNOSIS — N3943 Post-void dribbling: Secondary | ICD-10-CM

## 2022-01-12 DIAGNOSIS — Z1329 Encounter for screening for other suspected endocrine disorder: Secondary | ICD-10-CM

## 2022-01-12 DIAGNOSIS — M19012 Primary osteoarthritis, left shoulder: Secondary | ICD-10-CM

## 2022-01-12 DIAGNOSIS — F411 Generalized anxiety disorder: Secondary | ICD-10-CM

## 2022-01-12 MED ORDER — AMLODIPINE BESYLATE 10 MG PO TABS: ORAL_TABLET | ORAL | 3 refills | Status: DC

## 2022-01-12 MED ORDER — MELOXICAM 15 MG PO TABS: 15.0000 mg | ORAL_TABLET | Freq: Every day | ORAL | 3 refills | Status: DC

## 2022-01-12 MED ORDER — SILDENAFIL CITRATE 50 MG PO TABS: 50.0000 mg | ORAL_TABLET | Freq: Every day | ORAL | 5 refills | Status: DC | PRN

## 2022-01-12 MED ORDER — ESOMEPRAZOLE MAGNESIUM 40 MG PO CPDR: DELAYED_RELEASE_CAPSULE | ORAL | 3 refills | Status: DC

## 2022-01-12 MED ORDER — CLOBETASOL PROPIONATE 0.05 % EX CREA: 1.0000 "application " | TOPICAL_CREAM | Freq: Two times a day (BID) | CUTANEOUS | 2 refills | Status: DC

## 2022-01-12 MED ORDER — DIAZEPAM 2 MG PO TABS: 2.0000 mg | ORAL_TABLET | Freq: Every evening | ORAL | 3 refills | Status: DC | PRN

## 2022-01-12 MED ORDER — TERAZOSIN HCL 1 MG PO CAPS: 1.0000 mg | ORAL_CAPSULE | Freq: Every day | ORAL | 3 refills | Status: DC | PRN

## 2022-01-12 NOTE — Patient Instructions (Addendum)
Will get 2nd opinion on shoulder surgery ?Health Maintenance, Male ?Adopting a healthy lifestyle and getting preventive care are important in promoting health and wellness. Ask your health care provider about: ?The right schedule for you to have regular tests and exams. ?Things you can do on your own to prevent diseases and keep yourself healthy. ?What should I know about diet, weight, and exercise? ?Eat a healthy diet ? ?Eat a diet that includes plenty of vegetables, fruits, low-fat dairy products, and lean protein. ?Do not eat a lot of foods that are high in solid fats, added sugars, or sodium. ?Maintain a healthy weight ?Body mass index (BMI) is a measurement that can be used to identify possible weight problems. It estimates body fat based on height and weight. Your health care provider can help determine your BMI and help you achieve or maintain a healthy weight. ?Get regular exercise ?Get regular exercise. This is one of the most important things you can do for your health. Most adults should: ?Exercise for at least 150 minutes each week. The exercise should increase your heart rate and make you sweat (moderate-intensity exercise). ?Do strengthening exercises at least twice a week. This is in addition to the moderate-intensity exercise. ?Spend less time sitting. Even light physical activity can be beneficial. ?Watch cholesterol and blood lipids ?Have your blood tested for lipids and cholesterol at 65 years of age, then have this test every 5 years. ?You may need to have your cholesterol levels checked more often if: ?Your lipid or cholesterol levels are high. ?You are older than 65 years of age. ?You are at high risk for heart disease. ?What should I know about cancer screening? ?Many types of cancers can be detected early and may often be prevented. Depending on your health history and family history, you may need to have cancer screening at various ages. This may include screening for: ?Colorectal  cancer. ?Prostate cancer. ?Skin cancer. ?Lung cancer. ?What should I know about heart disease, diabetes, and high blood pressure? ?Blood pressure and heart disease ?High blood pressure causes heart disease and increases the risk of stroke. This is more likely to develop in people who have high blood pressure readings or are overweight. ?Talk with your health care provider about your target blood pressure readings. ?Have your blood pressure checked: ?Every 3-5 years if you are 17-65 years of age. ?Every year if you are 65 years old or older. ?If you are between the ages of 13 and 68 and are a current or former smoker, ask your health care provider if you should have a one-time screening for abdominal aortic aneurysm (AAA). ?Diabetes ?Have regular diabetes screenings. This checks your fasting blood sugar level. Have the screening done: ?Once every three years after age 65 if you are at a normal weight and have a low risk for diabetes. ?More often and at a younger age if you are overweight or have a high risk for diabetes. ?What should I know about preventing infection? ?Hepatitis B ?If you have a higher risk for hepatitis B, you should be screened for this virus. Talk with your health care provider to find out if you are at risk for hepatitis B infection. ?Hepatitis C ?Blood testing is recommended for: ?Everyone born from [birth year] through 1964. ?Anyone with known risk factors for hepatitis C. ?Sexually transmitted infections (STIs) ?You should be screened each year for STIs, including gonorrhea and chlamydia, if: ?You are sexually active and are younger than 65 years of age. ?You are older  than 65 years of age and your health care provider tells you that you are at risk for this type of infection. ?Your sexual activity has changed since you were last screened, and you are at increased risk for chlamydia or gonorrhea. Ask your health care provider if you are at risk. ?Ask your health care provider about whether you are at  high risk for HIV. Your health care provider may recommend a prescription medicine to help prevent HIV infection. If you choose to take medicine to prevent HIV, you should first get tested for HIV. You should then be tested every 3 months for as long as you are taking the medicine. ?Follow these instructions at home: ?Alcohol use ?Do not drink alcohol if your health care provider tells you not to drink. ?If you drink alcohol: ?Limit how much you have to 0-2 drinks a day. ?Know how much alcohol is in your drink. In the [place] one drink equals one 12 oz bottle of beer (355 mL), one 5 oz glass of wine (148 mL), or one 1? oz glass of hard liquor (44 mL). ?Lifestyle ?Do not use any products that contain nicotine or tobacco. These products include cigarettes, chewing tobacco, and vaping devices, such as e-cigarettes. If you need help quitting, ask your health care provider. ?Do not use street drugs. ?Do not share needles. ?Ask your health care provider for help if you need support or information about quitting drugs. ?General instructions ?Schedule regular health, dental, and eye exams. ?Stay current with your vaccines. ?Tell your health care provider if: ?You often feel depressed. ?You have ever been abused or do not feel safe at home. ?Summary ?Adopting a healthy lifestyle and getting preventive care are important in promoting health and wellness. ?Follow your health care provider's instructions about healthy diet, exercising, and getting tested or screened for diseases. ?Follow your health care provider's instructions on monitoring your cholesterol and blood pressure. ?This information is not intended to replace advice given to you by your health care provider. Make sure you discuss any questions you have with your health care provider. ?Document Revised: 02/17/2021 Document Reviewed: 02/17/2021 ?Elsevier Patient Education ? [employer]. ? ?

## 2022-01-13 ENCOUNTER — Other Ambulatory Visit: Payer: Self-pay

## 2022-01-13 DIAGNOSIS — N5231 Erectile dysfunction following radical prostatectomy: Secondary | ICD-10-CM

## 2022-01-13 LAB — LIPID PANEL W/REFLEX DIRECT LDL
Cholesterol: 157 mg/dL (ref <200)
HDL: 39 mg/dL — ABNORMAL LOW (ref >=40)
LDL Cholesterol (Calc): 101 mg/dL (calc) — ABNORMAL HIGH
Non-HDL Cholesterol (Calc): 118 mg/dL (calc) (ref <130)
Total CHOL/HDL Ratio: 4 (calc) (ref <5.0)
Triglycerides: 82 mg/dL (ref <150)

## 2022-01-13 LAB — COMPLETE METABOLIC PANEL WITH GFR
AG Ratio: 1.7 (calc) (ref 1.0–2.5)
ALT: 28 U/L (ref 9–46)
AST: 18 U/L (ref 10–35)
Albumin: 4.3 g/dL (ref 3.6–5.1)
Alkaline phosphatase (APISO): 61 U/L (ref 35–144)
BUN: 14 mg/dL (ref 7–25)
CO2: 23 mmol/L (ref 20–32)
Calcium: 9.4 mg/dL (ref 8.6–10.3)
Chloride: 105 mmol/L (ref 98–110)
Creat: 0.73 mg/dL (ref 0.70–1.35)
Globulin: 2.5 g/dL (calc) (ref 1.9–3.7)
Glucose, Bld: 98 mg/dL (ref 65–99)
Potassium: 4.6 mmol/L (ref 3.5–5.3)
Sodium: 139 mmol/L (ref 135–146)
Total Bilirubin: 1.3 mg/dL — ABNORMAL HIGH (ref 0.2–1.2)
Total Protein: 6.8 g/dL (ref 6.1–8.1)
eGFR: 102 mL/min/{1.73_m2} (ref >=60)

## 2022-01-13 LAB — CBC WITH DIFFERENTIAL/PLATELET
Absolute Monocytes: 462 cells/uL (ref 200–950)
Basophils Absolute: 53 cells/uL (ref 0–200)
Basophils Relative: 0.8 %
Eosinophils Absolute: 264 cells/uL (ref 15–500)
Eosinophils Relative: 4 %
HCT: 46.9 % (ref 38.5–50.0)
Hemoglobin: 16.7 g/dL (ref 13.2–17.1)
Lymphs Abs: 1716 cells/uL (ref 850–3900)
MCH: 31.6 pg (ref 27.0–33.0)
MCHC: 35.6 g/dL (ref 32.0–36.0)
MCV: 88.7 fL (ref 80.0–100.0)
MPV: 11.6 fL (ref 7.5–12.5)
Monocytes Relative: 7 %
Neutro Abs: 4105 cells/uL (ref 1500–7800)
Neutrophils Relative %: 62.2 %
Platelets: 212 10*3/uL (ref 140–400)
RBC: 5.29 10*6/uL (ref 4.20–5.80)
RDW: 12.5 % (ref 11.0–15.0)
Total Lymphocyte: 26 %
WBC: 6.6 10*3/uL (ref 3.8–10.8)

## 2022-01-13 LAB — TSH: TSH: 0.85 mIU/L (ref 0.40–4.50)

## 2022-01-13 MED ORDER — SILDENAFIL CITRATE 50 MG PO TABS: 50.0000 mg | ORAL_TABLET | Freq: Every day | ORAL | 5 refills | Status: DC | PRN

## 2022-01-13 NOTE — Progress Notes (Signed)
No concerns with labs they look good.  ?Your LDL, bad cholesterol, did increase a bit. Watch high fatty foods, fried foods and processed foods.

## 2022-01-13 NOTE — Telephone Encounter (Signed)
Patient requesting for Sildenafil to be sent to [facility] is [place].  ?

## 2022-01-15 NOTE — Progress Notes (Signed)
? ?Subjective:  ? ? Patient ID: Jack Grimes, male    DOB: [birth year], 66 y.o.   MRN: [id number] ? ?HPI ?Pt is a 66 yo male who presents to the clinic for CPE.  ? ?He does need some refills. He is doing pretty good otherwise. He denies any CP, palpitations, headaches or vision changes. He is scheduled for left total shoulder replacement surgery and wonders if this is the best option for right now. The pain is manageable.   ? ?.. ?Active Ambulatory Problems  ?  Diagnosis Date Noted  ? NEOPLASM, MALIGNANT, PROSTATE 10/27/2007  ? ADJUSTMENT DISORDER WITH ANXIETY 01/21/2009  ? VISUAL CHANGES 09/16/2009  ? HYPERTENSION, BENIGN ESSENTIAL 06/09/2007  ? Gastroesophageal reflux disease 10/25/2007  ? ALLERGIC URTICARIA 01/21/2009  ? BACK PAIN, LUMBAR 10/25/2007  ? LYMPHOMA, HX OF 11/14/2007  ? GAD (generalized anxiety disorder) 10/24/2012  ? Acquired elongated uvula 01/23/2013  ? Erectile dysfunction 01/23/2013  ? BPH (benign prostatic hyperplasia) 01/23/2013  ? Right lateral epicondylitis 02/10/2013  ? Right knee pain 09/25/2013  ? Erectile dysfunction of non-organic origin 05/11/2014  ? Multiple joint pain 10/31/2014  ? Subconjunctival hemorrhage of right eye 04/25/2015  ? Carpal tunnel syndrome, left 04/25/2015  ? Left shoulder pain 01/01/2016  ? Strong pulse 08/14/2016  ? 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/29/2017  ? Trouble in sleeping 12/09/2020  ? History of melanoma 01/20/2021  ? Multiple atypical skin moles 01/20/2021  ? Snoring 01/20/2021  ? Elevated bilirubin 01/20/2021  ? Sebaceous cyst 01/20/2021  ? Left inguinal hernia 08/04/2021  ? Left cervical radiculopathy 08/04/2021  ? Hyperplastic colonic polyp 08/04/2021  ? ?Resolved Ambulatory Problems  ?  Diagnosis Date Noted  ? No Resolved Ambulatory Problems  ? ?Past Medical History:  ?Diagnosis Date  ? Cancer [facility])   ? GERD (gastroesophageal reflux disease)   ? Hypertension   ? Prostate cancer ([place])   ? ? ? ?Review of Systems ? ?  See HPI.  ?Objective:  ?  Physical Exam ?Vitals reviewed.  ?Constitutional:   ?   Appearance: Normal appearance.  ?HENT:  ?   Head: Normocephalic.  ?Neck:  ?   Vascular: No carotid bruit.  ?Cardiovascular:  ?   Rate and Rhythm: Normal rate and regular rhythm.  ?   Pulses: Normal pulses.  ?   Heart sounds: Normal heart sounds. No murmur heard. ?Pulmonary:  ?   Effort: Pulmonary effort is normal.  ?   Breath sounds: Normal breath sounds.  ?Musculoskeletal:  ?   Cervical back: Normal range of motion.  ?   Right lower leg: No edema.  ?   Left lower leg: No edema.  ?Lymphadenopathy:  ?   Cervical: No cervical adenopathy.  ?Neurological:  ?   General: No focal deficit present.  ?   Mental Status: He is alert and oriented to person, place, and time.  ?Psychiatric:     ?   Mood and Affect: Mood normal.  ?Left shoulder: decreased ROM due to pain and stiffness.  ?.. ? ?  01/12/2022  ? 10:13 AM 08/04/2021  ? 10:40 AM 12/09/2020  ?  9:31 AM 11/28/2018  ?  8:14 AM 05/24/2018  ?  8:58 AM  ?Depression screen PHQ 2/9  ?Decreased Interest 0 0 0 0 0  ?Down, Depressed, Hopeless 0 0 0 0 0  ?PHQ - 2 Score 0 0 0 0 0  ?Altered sleeping   1  0  ?Tired, decreased energy   0  0  ?  Change in appetite   0  0  ?Feeling bad or failure about yourself    0  0  ?Trouble concentrating   0  0  ?Moving slowly or fidgety/restless   0  0  ?Suicidal thoughts   0  0  ?PHQ-9 Score   1  0  ?Difficult doing work/chores   Not difficult at all  Not difficult at all  ? ?.. ? ?  12/09/2020  ?  9:31 AM 11/28/2018  ?  8:14 AM 05/24/2018  ?  8:58 AM  ?GAD 7 : Generalized Anxiety Score  ?Nervous, Anxious, on Edge 0 0 0  ?Control/stop worrying 0 0 0  ?Worry too much - different things 0 0 0  ?Trouble relaxing 0 0 0  ?Restless 0 0 0  ?Easily annoyed or irritable 0 0 0  ?Afraid - awful might happen 0 0 0  ?Total GAD 7 Score 0 0 0  ?Anxiety Difficulty Not difficult at all Not difficult at all Not difficult at all  ? ?.. ? RO-AUA SYMPTOM   ? ? [place] Name 01/12/22 1000  ?  ?  ?  ? During the last Month  ?  Sensation of Bladder not Empty Less than 1 time in 5    ? Urinate<2 hours after last Not at all    ? Mult. stop/start when voiding Not at all    ? Difficult to postpone voiding Not at all    ? Weak urinary stream Not at all    ? Push/strain to begin urination Not at all    ? Times per night up to urinate Less than 1 time in 5    ?  ? OTHER  ? Total Score 2    ? ?  ?  ? ?  ? ? ? ? ? ? ?   ?Assessment & Plan:  ?..Jack Grimes was seen today for annual exam. ? ?Diagnoses and all orders for this visit: ? ?Essential hypertension, benign ?-     COMPLETE METABOLIC PANEL WITH GFR ?-     amLODipine (NORVASC) 10 MG tablet; Take one tablet daily. ? ?Lipid screening ?-     Lipid Panel w/reflex Direct LDL ? ?Benign prostatic hyperplasia with post-void dribbling ?-     terazosin (HYTRIN) 1 MG capsule; Take 1 capsule (1 mg total) by mouth daily as needed (urinary retention). ? ?Routine physical examination ?-     TSH ?-     Lipid Panel w/reflex Direct LDL ?-     COMPLETE METABOLIC PANEL WITH GFR ?-     CBC with Differential/Platelet ? ?Thyroid disorder screen ?-     TSH ? ?Screening PSA (prostate specific antigen) ? ?GAD (generalized anxiety disorder) ?-     diazepam (VALIUM) 2 MG tablet; Take 1 tablet (2 mg total) by mouth at bedtime as needed. ? ?Gastroesophageal reflux disease, unspecified whether esophagitis present ?-     esomeprazole (NEXIUM) 40 MG capsule; Take one capsule by mouth daily at 12 noon ? ?HYPERTENSION, BENIGN ESSENTIAL ?-     COMPLETE METABOLIC PANEL WITH GFR ?-     amLODipine (NORVASC) 10 MG tablet; Take one tablet daily. ? ?Erectile dysfunction after radical prostatectomy ?-     Discontinue: sildenafil (VIAGRA) 50 MG tablet; Take 1 tablet (50 mg total) by mouth daily as needed for erectile dysfunction. ? ?Trouble in sleeping ? ?Allergic urticaria ?-     clobetasol cream (TEMOVATE) 0.05 %; Apply 1 application. topically 2 (two) times daily. ? ?  Localized osteoarthritis of left shoulder ?-     meloxicam (MOBIC) 15 MG  tablet; Take 1 tablet (15 mg total) by mouth daily. ?-     Ambulatory referral to Orthopedic Surgery ? ? ?Marland Kitchen.Start a regular exercise program and make sure you are eating a healthy diet ?Try to eat 4 servings of dairy a day or take a calcium supplement ('500mg'$  twice a day). ?Vitals look great.  ?Fasting labs ordered.  ?AUA score low. Refilled medications.  ?Colonoscopy UTD.  ?Covid vaccine x3.  ?Declined flu and shingrix.  ? ?Referral for 2nd opinion for left shoulder arthritis and surgery.  ? ?Follow up in 6 months.  ? ? ? ?

## 2022-03-01 ENCOUNTER — Other Ambulatory Visit: Payer: Self-pay | Admitting: Physician Assistant

## 2022-03-01 DIAGNOSIS — G479 Sleep disorder, unspecified: Secondary | ICD-10-CM

## 2023-02-01 ENCOUNTER — Encounter: Payer: BC Managed Care – PPO | Admitting: Physician Assistant

## 2023-02-08 ENCOUNTER — Ambulatory Visit: Payer: Medicare PPO | Admitting: Physician Assistant

## 2023-02-08 ENCOUNTER — Encounter: Payer: Self-pay | Admitting: Physician Assistant

## 2023-02-08 VITALS — BP 129/73 | HR 53 | Resp 20 | Ht 71.0 in | Wt 173.1 lb

## 2023-02-08 DIAGNOSIS — Z13220 Encounter for screening for lipoid disorders: Secondary | ICD-10-CM

## 2023-02-08 DIAGNOSIS — N3943 Post-void dribbling: Secondary | ICD-10-CM

## 2023-02-08 DIAGNOSIS — N401 Enlarged prostate with lower urinary tract symptoms: Secondary | ICD-10-CM

## 2023-02-08 DIAGNOSIS — Z1329 Encounter for screening for other suspected endocrine disorder: Secondary | ICD-10-CM

## 2023-02-08 DIAGNOSIS — F411 Generalized anxiety disorder: Secondary | ICD-10-CM

## 2023-02-08 DIAGNOSIS — Z0000 Encounter for general adult medical examination without abnormal findings: Secondary | ICD-10-CM

## 2023-02-08 DIAGNOSIS — Z87891 Personal history of nicotine dependence: Secondary | ICD-10-CM

## 2023-02-08 DIAGNOSIS — M19012 Primary osteoarthritis, left shoulder: Secondary | ICD-10-CM

## 2023-02-08 DIAGNOSIS — L500 Allergic urticaria: Secondary | ICD-10-CM | POA: Diagnosis not present

## 2023-02-08 DIAGNOSIS — K219 Gastro-esophageal reflux disease without esophagitis: Secondary | ICD-10-CM

## 2023-02-08 DIAGNOSIS — I10 Essential (primary) hypertension: Secondary | ICD-10-CM | POA: Diagnosis not present

## 2023-02-08 DIAGNOSIS — N5231 Erectile dysfunction following radical prostatectomy: Secondary | ICD-10-CM

## 2023-02-08 DIAGNOSIS — Z131 Encounter for screening for diabetes mellitus: Secondary | ICD-10-CM

## 2023-02-08 DIAGNOSIS — J3089 Other allergic rhinitis: Secondary | ICD-10-CM

## 2023-02-08 MED ORDER — AMLODIPINE BESYLATE 10 MG PO TABS: ORAL_TABLET | ORAL | 3 refills | Status: DC

## 2023-02-08 MED ORDER — SILDENAFIL CITRATE 50 MG PO TABS: 50.0000 mg | ORAL_TABLET | Freq: Every day | ORAL | 5 refills | Status: DC | PRN

## 2023-02-08 MED ORDER — CLOBETASOL PROPIONATE 0.05 % EX CREA: 1.0000 | TOPICAL_CREAM | Freq: Two times a day (BID) | CUTANEOUS | 2 refills | Status: AC

## 2023-02-08 MED ORDER — DIAZEPAM 2 MG PO TABS: 2.0000 mg | ORAL_TABLET | Freq: Every evening | ORAL | 3 refills | Status: DC | PRN

## 2023-02-08 MED ORDER — TERAZOSIN HCL 1 MG PO CAPS: 1.0000 mg | ORAL_CAPSULE | Freq: Every day | ORAL | 3 refills | Status: DC | PRN

## 2023-02-08 MED ORDER — ESOMEPRAZOLE MAGNESIUM 40 MG PO CPDR: DELAYED_RELEASE_CAPSULE | ORAL | 3 refills | Status: DC

## 2023-02-08 MED ORDER — FLUTICASONE PROPIONATE 50 MCG/ACT NA SUSP: 1.0000 | Freq: Every day | NASAL | 11 refills | Status: AC

## 2023-02-08 NOTE — Progress Notes (Signed)
Complete physical exam  Patient: Jack Grimes   DOB: December 02, 1955   67 y.o. Male  MRN: [id number]  Subjective:    Chief Complaint  Patient presents with   Annual Exam    Jack Grimes is a 67 y.o. male who presents today for a complete physical exam. He reports consuming a general diet.  Walking about 3 times a week about 30 minutes.   He generally feels well. He reports sleeping well. He does not have additional problems to discuss today.    Most recent fall risk assessment:    02/08/2023    9:23 AM  Fall Risk   Falls in the past year? 0  Number falls in past yr: 0  Injury with Fall? 0  Risk for fall due to : No Fall Risks  Follow up Falls evaluation completed     Most recent depression screenings:    02/08/2023    9:23 AM 01/12/2022   10:13 AM  PHQ 2/9 Scores  PHQ - 2 Score 0 0    Vision:Within last year, Dental: No current dental problems and Receives regular dental care, and PSA: Agrees to PSA testing  Patient Active Problem List   Diagnosis Date Noted   Left inguinal hernia 08/04/2021   Left cervical radiculopathy 08/04/2021   Hyperplastic colonic polyp 08/04/2021   History of melanoma 01/20/2021   Multiple atypical skin moles 01/20/2021   Snoring 01/20/2021   Elevated bilirubin 01/20/2021   Sebaceous cyst 01/20/2021   Trouble in sleeping 12/09/2020   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/29/2017   Strong pulse 08/14/2016   Left shoulder pain 01/01/2016   Subconjunctival hemorrhage of right eye 04/25/2015   Carpal tunnel syndrome, left 04/25/2015   Multiple joint pain 10/31/2014   Erectile dysfunction of non-organic origin 05/11/2014   Right knee pain 09/25/2013   Right lateral epicondylitis 02/10/2013   Acquired elongated uvula 01/23/2013   Erectile dysfunction 01/23/2013   BPH (benign prostatic hyperplasia) 01/23/2013   GAD (generalized anxiety disorder) 10/24/2012   VISUAL CHANGES 09/16/2009   ADJUSTMENT DISORDER WITH ANXIETY 01/21/2009   ALLERGIC  URTICARIA 01/21/2009   LYMPHOMA, HX OF 11/14/2007   NEOPLASM, MALIGNANT, PROSTATE 10/27/2007   Gastroesophageal reflux disease 10/25/2007   BACK PAIN, LUMBAR 10/25/2007   HYPERTENSION, BENIGN ESSENTIAL 06/09/2007   Past Medical History:  Diagnosis Date   BPH (benign prostatic hyperplasia)    urology [place]   Cancer [facility])    low grade follicular lymphoma grade 2, stage 1E, S/P external beam radiation, prostate CA    GERD (gastroesophageal reflux disease)    Hypertension    Prostate cancer ([facility])    Family History  Problem Relation Age of Onset   Prostate cancer Father 21   Healthy Brother    Healthy Brother    Breast cancer Neg Hx    Colon cancer Neg Hx    Allergies  Allergen Reactions   Lisinopril-Hydrochlorothiazide Swelling    Calf swelling.   Sulfonamide Derivatives Hives and Itching   Ciprofloxacin Hives   Trazodone And Nefazodone     Nightmares      Patient Care Team: Nolene Ebbs as PCP - General (Family Medicine) Felicita Gage, RN Nurse Navigator as Registered Nurse (Medical Oncology)   Outpatient Medications Prior to Visit  Medication Sig   meloxicam (MOBIC) 15 MG tablet Take 1 tablet (15 mg total) by mouth daily.   [DISCONTINUED] amLODipine (NORVASC) 10 MG tablet Take one tablet daily.   [DISCONTINUED] clobetasol cream (  TEMOVATE) 0.05 % Apply 1 application. topically 2 (two) times daily.   [DISCONTINUED] diazepam (VALIUM) 2 MG tablet Take 1 tablet (2 mg total) by mouth at bedtime as needed.   [DISCONTINUED] esomeprazole (NEXIUM) 40 MG capsule Take one capsule by mouth daily at 12 noon   [DISCONTINUED] fluticasone (FLONASE) 50 MCG/ACT nasal spray Place 1 spray into both nostrils daily.   [DISCONTINUED] sildenafil (VIAGRA) 50 MG tablet Take 1 tablet (50 mg total) by mouth daily as needed for erectile dysfunction.   [DISCONTINUED] terazosin (HYTRIN) 1 MG capsule Take 1 capsule (1 mg total) by mouth daily as needed (urinary retention).   No  facility-administered medications prior to visit.    ROS  See HPI.       Objective:     BP 129/73 (BP Location: Left Arm, Cuff Size: Normal)   Pulse (!) 53   Resp 20   Ht 5\' 11"  (1.803 m)   Wt 173 lb 1.9 oz (78.5 kg)   SpO2 98%   BMI 24.15 kg/m  BP Readings from Last 3 Encounters:  02/08/23 129/73  01/12/22 127/67  09/09/21 107/72   Wt Readings from Last 3 Encounters:  02/08/23 173 lb 1.9 oz (78.5 kg)  01/12/22 178 lb (80.7 kg)  09/23/21 175 lb (79.4 kg)  ..    02/08/2023    9:23 AM 01/12/2022   10:13 AM 08/04/2021   10:40 AM 12/09/2020    9:31 AM 11/28/2018    8:14 AM  Depression screen PHQ 2/9  Decreased Interest 0 0 0 0 0  Down, Depressed, Hopeless 0 0 0 0 0  PHQ - 2 Score 0 0 0 0 0  Altered sleeping    1   Tired, decreased energy    0   Change in appetite    0   Feeling bad or failure about yourself     0   Trouble concentrating    0   Moving slowly or fidgety/restless    0   Suicidal thoughts    0   PHQ-9 Score    1   Difficult doing work/chores    Not difficult at all          Physical Exam  BP 129/73 (BP Location: Left Arm, Cuff Size: Normal)   Pulse (!) 53   Resp 20   Ht 5\' 11"  (1.803 m)   Wt 173 lb 1.9 oz (78.5 kg)   SpO2 98%   BMI 24.15 kg/m   General Appearance:    Alert, cooperative, no distress, appears stated age  Head:    Normocephalic, without obvious abnormality, atraumatic  Eyes:    PERRL, conjunctiva/corneas clear, EOM's intact, fundi    benign, both eyes       Ears:    Normal TM's and external ear canals, both ears  Nose:   Nares normal, septum midline, mucosa normal, no drainage    or sinus tenderness  Throat:   Lips, mucosa, and tongue normal; teeth and gums normal  Neck:   Supple, symmetrical, trachea midline, no adenopathy;       thyroid:  No enlargement/tenderness/nodules; no carotid   bruit or JVD  Back:     Symmetric, no curvature, ROM normal, no CVA tenderness  Lungs:     Clear to auscultation bilaterally,  respirations unlabored  Chest wall:    No tenderness or deformity  Heart:    Regular rate and rhythm, S1 and S2 normal, no murmur, rub   or gallop  Abdomen:     Soft, non-tender, bowel sounds active all four quadrants,    no masses, no organomegaly        Extremities:   Extremities normal, atraumatic, no cyanosis or edema  Pulses:   2+ and symmetric all extremities  Skin:   Skin color, texture, turgor normal, no rashes or lesions  Lymph nodes:   Cervical, supraclavicular, and axillary nodes normal  Neurologic:   CNII-XII intact. Normal strength, sensation and reflexes      throughout      Assessment & Plan:    Routine Health Maintenance and Physical Exam  Immunization History  Administered Date(s) Administered   PFIZER(Purple Top)SARS-COV-2 Vaccination 06/08/2020, 07/09/2020, 10/28/2020   Tdap 05/16/2012    Health Maintenance  Topic Date Due   Medicare Annual Wellness (AWV)  Never done   Zoster Vaccines- Shingrix (1 of 2) Never done   DTaP/Tdap/Td (2 - Td or Tdap) 05/16/2022   COVID-19 Vaccine (4 - 2023-24 season) 02/24/2023 (Originally 06/12/2022)   Pneumonia Vaccine 68+ Years old (1 of 2 - PCV) 02/08/2024 (Originally 04/14/1962)   INFLUENZA VACCINE  08/14/2026 (Originally 05/13/2023)   COLONOSCOPY (Pts 45-61yrs Insurance coverage will need to be confirmed)  08/03/2024   Hepatitis C Screening  Completed   HIV Screening  Completed   HPV VACCINES  Aged Out    Discussed health benefits of physical activity, and encouraged him to engage in regular exercise appropriate for his age and condition.  Marland KitchenJonny Ruiz was seen today for annual exam.  Diagnoses and all orders for this visit:  Routine physical examination -     Lipid Panel With LDL/HDL Ratio -     PSA -     TSH -     Comprehensive metabolic panel  HYPERTENSION, BENIGN ESSENTIAL -     Cancel: CBC with Differential/Platelet -     amLODipine (NORVASC) 10 MG tablet; Take one tablet daily. -     CBC with  Differential/Platelet -     Comprehensive metabolic panel  Allergic urticaria -     clobetasol cream (TEMOVATE) 0.05 %; Apply 1 Application topically 2 (two) times daily.  GAD (generalized anxiety disorder) -     diazepam (VALIUM) 2 MG tablet; Take 1 tablet (2 mg total) by mouth at bedtime as needed.  Gastroesophageal reflux disease, unspecified whether esophagitis present -     esomeprazole (NEXIUM) 40 MG capsule; Take one capsule by mouth daily at 12 noon  Localized osteoarthritis of left shoulder -     fluticasone (FLONASE) 50 MCG/ACT nasal spray; Place 1 spray into both nostrils daily.  Non-seasonal allergic rhinitis, unspecified trigger -     fluticasone (FLONASE) 50 MCG/ACT nasal spray; Place 1 spray into both nostrils daily.  Benign prostatic hyperplasia with post-void dribbling -     Cancel: PSA -     Cancel: CBC with Differential/Platelet -     terazosin (HYTRIN) 1 MG capsule; Take 1 capsule (1 mg total) by mouth daily as needed (urinary retention). 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  Other orders -     Discontinue: atorvastatin (LIPITOR) 20 MG tablet; Take 1 tablet (20 mg total) by mouth at bedtime.   Discussed shingrix/pneumonia/tdap with patient and encouraged him to get vaccines at local pharmacy  .Marland KitchenStart a regular exercise program and make sure you are eating a healthy diet Try to eat 4 servings of dairy a day or take a calcium  supplement (500mg  twice a day). Screening labs ordered today PHQ no concerns Pt is a former smoker but quick more than 15 years ago Qualifies for AAA screening, [place] ordered, discussed with patient Medications refilled Vitals look great Encouraged to have medicare wellness exam scheduled       Tandy Gaw, PA-C

## 2023-02-08 NOTE — Patient Instructions (Addendum)
Vaccines: shingrix/Tdap/pneumonia(get at local pharmacy)   Consider abdominal aortic ultrasound  Health Maintenance After Age 66 After age 47, you are at a higher risk for certain long-term diseases and infections as well as injuries from falls. Falls are a major cause of broken bones and head injuries in people who are older than age 12. Getting regular preventive care can help to keep you healthy and well. Preventive care includes getting regular testing and making lifestyle changes as recommended by your health care provider. Talk with your health care provider about: Which screenings and tests you should have. A screening is a test that checks for a disease when you have no symptoms. A diet and exercise plan that is right for you. What should I know about screenings and tests to prevent falls? Screening and testing are the best ways to find a health problem early. Early diagnosis and treatment give you the best chance of managing medical conditions that are common after age 65. Certain conditions and lifestyle choices may make you more likely to have a fall. Your health care provider may recommend: Regular vision checks. Poor vision and conditions such as cataracts can make you more likely to have a fall. If you wear glasses, make sure to get your prescription updated if your vision changes. Medicine review. Work with your health care provider to regularly review all of the medicines you are taking, including over-the-counter medicines. Ask your health care provider about any side effects that may make you more likely to have a fall. Tell your health care provider if any medicines that you take make you feel dizzy or sleepy. Strength and balance checks. Your health care provider may recommend certain tests to check your strength and balance while standing, walking, or changing positions. Foot health exam. Foot pain and numbness, as well as not wearing proper footwear, can make you more likely to  have a fall. Screenings, including: Osteoporosis screening. Osteoporosis is a condition that causes the bones to get weaker and break more easily. Blood pressure screening. Blood pressure changes and medicines to control blood pressure can make you feel dizzy. Depression screening. You may be more likely to have a fall if you have a fear of falling, feel depressed, or feel unable to do activities that you used to do. Alcohol use screening. Using too much alcohol can affect your balance and may make you more likely to have a fall. Follow these instructions at home: Lifestyle Do not drink alcohol if: Your health care provider tells you not to drink. If you drink alcohol: Limit how much you have to: 0-1 drink a day for women. 0-2 drinks a day for men. Know how much alcohol is in your drink. In the [place] one drink equals one 12 oz bottle of beer (355 mL), one 5 oz glass of wine (148 mL), or one 1 oz glass of hard liquor (44 mL). Do not use any products that contain nicotine or tobacco. These products include cigarettes, chewing tobacco, and vaping devices, such as e-cigarettes. If you need help quitting, ask your health care provider. Activity  Follow a regular exercise program to stay fit. This will help you maintain your balance. Ask your health care provider what types of exercise are appropriate for you. If you need a cane or walker, use it as recommended by your health care provider. Wear supportive shoes that have nonskid soles. Safety  Remove any tripping hazards, such as rugs, cords, and clutter. Install safety equipment such as grab  bars in bathrooms and safety rails on stairs. Keep rooms and walkways well-lit. General instructions Talk with your health care provider about your risks for falling. Tell your health care provider if: You fall. Be sure to tell your health care provider about all falls, even ones that seem minor. You feel dizzy, tiredness (fatigue), or  off-balance. Take over-the-counter and prescription medicines only as told by your health care provider. These include supplements. Eat a healthy diet and maintain a healthy weight. A healthy diet includes low-fat dairy products, low-fat (lean) meats, and fiber from whole grains, beans, and lots of fruits and vegetables. Stay current with your vaccines. Schedule regular health, dental, and eye exams. Summary Having a healthy lifestyle and getting preventive care can help to protect your health and wellness after age 68. Screening and testing are the best way to find a health problem early and help you avoid having a fall. Early diagnosis and treatment give you the best chance for managing medical conditions that are more common for people who are older than age 23. Falls are a major cause of broken bones and head injuries in people who are older than age 56. Take precautions to prevent a fall at home. Work with your health care provider to learn what changes you can make to improve your health and wellness and to prevent falls. This information is not intended to replace advice given to you by your health care provider. Make sure you discuss any questions you have with your health care provider. Document Revised: 02/17/2021 Document Reviewed: 02/17/2021 Elsevier Patient Education  2023 [employer].

## 2023-02-09 LAB — CBC WITH DIFFERENTIAL/PLATELET
Basophils Absolute: 0.1 10*3/uL (ref 0.0–0.2)
Basos: 1 %
EOS (ABSOLUTE): 0.2 10*3/uL (ref 0.0–0.4)
Eos: 3 %
Hematocrit: 46.9 % (ref 37.5–51.0)
Hemoglobin: 16.2 g/dL (ref 13.0–17.7)
Immature Grans (Abs): 0 10*3/uL (ref 0.0–0.1)
Immature Granulocytes: 0 %
Lymphocytes Absolute: 1.9 10*3/uL (ref 0.7–3.1)
Lymphs: 29 %
MCH: 31.2 pg (ref 26.6–33.0)
MCHC: 34.5 g/dL (ref 31.5–35.7)
MCV: 90 fL (ref 79–97)
Monocytes Absolute: 0.5 10*3/uL (ref 0.1–0.9)
Monocytes: 7 %
Neutrophils Absolute: 3.9 10*3/uL (ref 1.4–7.0)
Neutrophils: 60 %
Platelets: 211 10*3/uL (ref 150–450)
RBC: 5.19 x10E6/uL (ref 4.14–5.80)
RDW: 12.3 % (ref 11.6–15.4)
WBC: 6.5 10*3/uL (ref 3.4–10.8)

## 2023-02-09 LAB — COMPREHENSIVE METABOLIC PANEL
ALT: 25 IU/L (ref 0–44)
AST: 16 IU/L (ref 0–40)
Albumin/Globulin Ratio: 2 (ref 1.2–2.2)
Albumin: 4.5 g/dL (ref 3.9–4.9)
Alkaline Phosphatase: 62 IU/L (ref 44–121)
BUN/Creatinine Ratio: 16 (ref 10–24)
BUN: 12 mg/dL (ref 8–27)
Bilirubin Total: 1 mg/dL (ref 0.0–1.2)
CO2: 21 mmol/L (ref 20–29)
Calcium: 9.4 mg/dL (ref 8.6–10.2)
Chloride: 103 mmol/L (ref 96–106)
Creatinine, Ser: 0.76 mg/dL (ref 0.76–1.27)
Globulin, Total: 2.3 g/dL (ref 1.5–4.5)
Glucose: 92 mg/dL (ref 70–99)
Potassium: 4.3 mmol/L (ref 3.5–5.2)
Sodium: 140 mmol/L (ref 134–144)
Total Protein: 6.8 g/dL (ref 6.0–8.5)
eGFR: 100 mL/min/{1.73_m2} (ref >59)

## 2023-02-09 LAB — PSA: Prostate Specific Ag, Serum: <0.1 ng/mL (ref 0.0–4.0)

## 2023-02-09 LAB — LIPID PANEL WITH LDL/HDL RATIO
Cholesterol, Total: 169 mg/dL (ref 100–199)
HDL: 46 mg/dL (ref >39)
LDL Chol Calc (NIH): 108 mg/dL — ABNORMAL HIGH (ref 0–99)
LDL/HDL Ratio: 2.3 ratio (ref 0.0–3.6)
Triglycerides: 80 mg/dL (ref 0–149)
VLDL Cholesterol Cal: 15 mg/dL (ref 5–40)

## 2023-02-09 LAB — TSH: TSH: 1.55 u[IU]/mL (ref 0.450–4.500)

## 2023-02-09 NOTE — Progress Notes (Signed)
Hi Neamiah, all of your labs look good except LDL cholesterol is just a little elevated.  Risk for cardiac disease is actually extremely high anything 10% or higher is considered significant and I would highly recommend starting a statin at bedtime to reduce your risk for heart attack and stroke.  Your current risk is approximated around 19% over the next 10 years to have a cardiac event.  If you are okay with this please let us know and I will send an updated prescription to your pharmacy.  You can follow-up with Jade in 3 months in regard to the new start medication.  The 10-year ASCVD risk score (Arnett DK, et al., 2019) is: 19.1%   Values used to calculate the score:     Age: 67 years     Sex: Male     Is Non-Hispanic African American: No     Diabetic: No     Tobacco smoker: Yes     Systolic Blood Pressure: 129 mmHg     Is BP treated: Yes     HDL Cholesterol: 46 mg/dL     Total Cholesterol: 169 mg/dL

## 2023-02-10 ENCOUNTER — Telehealth: Payer: Self-pay | Admitting: Physician Assistant

## 2023-02-10 MED ORDER — ATORVASTATIN CALCIUM 20 MG PO TABS: 20.0000 mg | ORAL_TABLET | Freq: Every day | ORAL | 3 refills | Status: DC

## 2023-02-10 NOTE — Progress Notes (Signed)
Medicine sent to [facility]. Recommend lab check in 3 months.

## 2023-02-10 NOTE — Telephone Encounter (Signed)
Patient called about new medication has questions he would like a call back [phone]

## 2023-02-11 ENCOUNTER — Other Ambulatory Visit: Payer: Self-pay

## 2023-02-11 MED ORDER — ATORVASTATIN CALCIUM 20 MG PO TABS: 20.0000 mg | ORAL_TABLET | Freq: Every day | ORAL | 3 refills | Status: DC

## 2023-02-11 NOTE — Telephone Encounter (Signed)
Called patient to go over labs. Left voicemail for return call.

## 2023-06-03 DIAGNOSIS — K219 Gastro-esophageal reflux disease without esophagitis: Secondary | ICD-10-CM | POA: Diagnosis not present

## 2023-06-03 DIAGNOSIS — R1310 Dysphagia, unspecified: Secondary | ICD-10-CM | POA: Diagnosis not present

## 2023-06-22 ENCOUNTER — Ambulatory Visit: Payer: Medicare PPO | Admitting: Physician Assistant

## 2023-06-22 ENCOUNTER — Encounter: Payer: Self-pay | Admitting: Physician Assistant

## 2023-06-22 VITALS — BP 140/86 | HR 61 | Ht 71.0 in | Wt 170.0 lb

## 2023-06-22 DIAGNOSIS — F411 Generalized anxiety disorder: Secondary | ICD-10-CM | POA: Diagnosis not present

## 2023-06-22 DIAGNOSIS — L723 Sebaceous cyst: Secondary | ICD-10-CM

## 2023-06-22 DIAGNOSIS — Z79899 Other long term (current) drug therapy: Secondary | ICD-10-CM | POA: Diagnosis not present

## 2023-06-22 DIAGNOSIS — I10 Essential (primary) hypertension: Secondary | ICD-10-CM | POA: Diagnosis not present

## 2023-06-22 DIAGNOSIS — E7800 Pure hypercholesterolemia, unspecified: Secondary | ICD-10-CM

## 2023-06-22 DIAGNOSIS — L500 Allergic urticaria: Secondary | ICD-10-CM

## 2023-06-22 NOTE — Progress Notes (Signed)
Established Patient Office Visit  Subjective   Patient ID: Jack Grimes, male    DOB: [birth year]  Age: 67 y.o. MRN: [id number]  Chief Complaint  Patient presents with   Medical Management of Chronic Issues    Pt was placed on atovastain and he states he's been tolerating well with the exception of hives    HPI Pt is a 67 yo male with HTN, elevated LDL who presents to the clinic for follow up.   He is doing ok. He is taking BP medication but did not take this morning. Not really checking BP. Denies any  CP, palpitations, headaches or vision changes.   He is taking lipitor 20mg . He has noticed some intermittent skin changes. Hew will get bumps on body in random places. They are not really itchy. They are irritating. He got one set of bumps on left abdomen that were really bothersome and thought they could be shingles. He wonders if could be allergic to lipitor.    He has a non tender bump on the right side of his neck he would like looked at.   .. 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   Erectile dysfunction 01/23/2013   BPH (benign prostatic hyperplasia) 01/23/2013   Right lateral epicondylitis 02/10/2013   Right knee pain 09/25/2013   Erectile dysfunction of non-organic origin 05/11/2014   Multiple joint pain 10/31/2014   Subconjunctival hemorrhage of right eye 04/25/2015   Carpal tunnel syndrome, left 04/25/2015   Left shoulder pain 01/01/2016   Strong pulse 08/14/2016   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/29/2017   Trouble in sleeping 12/09/2020   History of melanoma  01/20/2021   Multiple atypical skin moles 01/20/2021   Snoring 01/20/2021   Elevated bilirubin 01/20/2021   Sebaceous cyst 01/20/2021   Left inguinal hernia 08/04/2021   Left cervical radiculopathy 08/04/2021   Hyperplastic colonic polyp 08/04/2021   Resolved Ambulatory Problems    Diagnosis Date Noted   No Resolved Ambulatory Problems   Past Medical History:  Diagnosis Date   Cancer (HCC)    GERD (gastroesophageal reflux disease)    Hypertension    Prostate cancer (HCC)      ROS See HPI.    Objective:     BP (!) 140/86   Pulse 61   Ht 5\' 11"  (1.803 m)   Wt 170 lb (77.1 kg)   SpO2 99%   BMI 23.71 kg/m  BP Readings from Last 3 Encounters:  06/22/23 (!) 140/86  02/08/23 129/73  01/12/22 127/67   Wt Readings from Last 3 Encounters:  06/22/23 170 lb (77.1 kg)  02/08/23 173 lb 1.9 oz (78.5 kg)  01/12/22 178 lb (80.7 kg)      Physical Exam Constitutional:      Appearance: Normal appearance.  HENT:     Head: Normocephalic.  Neck:     Comments: Superficial pea-sized cyst right upper neck. Mobile and non-tender.  Cardiovascular:     Rate and Rhythm: Normal rate and regular rhythm.     Heart sounds: Normal heart sounds.  Pulmonary:     Effort: Pulmonary effort is normal.  Breath sounds: Normal breath sounds.  Neurological:     General: No focal deficit present.     Mental Status: He is alert and oriented to person, place, and time.  Psychiatric:        Mood and Affect: Mood normal.        The ASCVD Risk score (Arnett DK, et al., 2019) failed to calculate for the following reasons:   The valid total cholesterol range is 130 to 320 mg/dL    Assessment & Plan:  .Marland KitchenJohn was seen today for medical management of chronic issues.  Diagnoses and all orders for this visit:  HYPERTENSION, BENIGN ESSENTIAL -     CMP14+EGFR  Medication management -     Lipid panel  Elevated LDL cholesterol level -     Lipid panel  Allergic urticaria  GAD  (generalized anxiety disorder)  Sebaceous cyst  Pt has a hx of allergic urticaria but I do not think patient is describing hives but maybe a folliculitis Discussed keeping skin clean and using dial soap Continue lipitor, recheck lipid panel Continue BP medication, recheck in 2 weeks when taking medication Discussed seb cyst and benign nature Follow up in 6 months  Return in about 6 months (around 12/20/2023).    Tandy Gaw, PA-C

## 2023-06-23 LAB — CMP14+EGFR
ALT: 25 IU/L (ref 0–44)
AST: 16 IU/L (ref 0–40)
Albumin: 4.6 g/dL (ref 3.9–4.9)
Alkaline Phosphatase: 73 IU/L (ref 44–121)
BUN/Creatinine Ratio: 17 (ref 10–24)
BUN: 12 mg/dL (ref 8–27)
Bilirubin Total: 1.1 mg/dL (ref 0.0–1.2)
CO2: 24 mmol/L (ref 20–29)
Calcium: 9.3 mg/dL (ref 8.6–10.2)
Chloride: 102 mmol/L (ref 96–106)
Creatinine, Ser: 0.71 mg/dL — ABNORMAL LOW (ref 0.76–1.27)
Globulin, Total: 2 g/dL (ref 1.5–4.5)
Glucose: 81 mg/dL (ref 70–99)
Potassium: 4.7 mmol/L (ref 3.5–5.2)
Sodium: 139 mmol/L (ref 134–144)
Total Protein: 6.6 g/dL (ref 6.0–8.5)
eGFR: 101 mL/min/{1.73_m2} (ref >59)

## 2023-06-23 LAB — LIPID PANEL
Chol/HDL Ratio: 2.9 ratio (ref 0.0–5.0)
Cholesterol, Total: 123 mg/dL (ref 100–199)
HDL: 43 mg/dL (ref >39)
LDL Chol Calc (NIH): 68 mg/dL (ref 0–99)
Triglycerides: 56 mg/dL (ref 0–149)
VLDL Cholesterol Cal: 12 mg/dL (ref 5–40)

## 2023-06-23 NOTE — Progress Notes (Signed)
LDL much better. Looks GREAT. Labs look really good. Stay on same medications.

## 2023-06-25 ENCOUNTER — Encounter: Payer: Self-pay | Admitting: Physician Assistant

## 2023-06-25 DIAGNOSIS — R1310 Dysphagia, unspecified: Secondary | ICD-10-CM | POA: Diagnosis not present

## 2023-06-25 DIAGNOSIS — K222 Esophageal obstruction: Secondary | ICD-10-CM | POA: Diagnosis not present

## 2023-07-16 ENCOUNTER — Telehealth: Payer: Self-pay | Admitting: Physician Assistant

## 2023-07-16 MED ORDER — PREDNISONE 20 MG PO TABS: 20.0000 mg | ORAL_TABLET | Freq: Every day | ORAL | 0 refills | Status: DC

## 2023-07-16 NOTE — Telephone Encounter (Signed)
Patient called he stated that the medication atorvastation 20mg  is causing him to get hives, upper lip is swelling, itching, he stop taking the medication he is asking if he should be prescribed an epi pen Please advise Pharmacy [facility] [address] [facility] [place]  Phone number [phone]

## 2023-07-16 NOTE — Telephone Encounter (Signed)
Called spoke with patient he is scheduled for an appointment to discuss swelling in lips after taking atorvastation he is going to continue to take otc benadryl

## 2023-07-20 ENCOUNTER — Encounter: Payer: Self-pay | Admitting: Physician Assistant

## 2023-07-20 ENCOUNTER — Ambulatory Visit: Payer: Medicare PPO | Admitting: Physician Assistant

## 2023-07-20 VITALS — BP 117/78 | HR 57 | Ht 71.0 in | Wt 172.0 lb

## 2023-07-20 DIAGNOSIS — L509 Urticaria, unspecified: Secondary | ICD-10-CM | POA: Diagnosis not present

## 2023-07-20 DIAGNOSIS — T783XXA Angioneurotic edema, initial encounter: Secondary | ICD-10-CM | POA: Insufficient documentation

## 2023-07-20 MED ORDER — EPINEPHRINE 0.3 MG/0.3ML IJ SOAJ
0.3000 mg | Freq: Once | INTRAMUSCULAR | 0 refills | Status: AC
Start: 2023-07-20 — End: 2023-07-20

## 2023-07-20 NOTE — Patient Instructions (Addendum)
Pepcid 20mg  and benadryl 50mg  only when you get hives may add another 50mg  on benadryl after 15 minutes if no improvement  Hives Hives (urticaria) are itchy, red, swollen areas of skin. They can show up on any part of the body. They often fade within 24 hours (acute hives). If you get new hives after the old ones fade and the cycle goes on for many days or weeks, it is called chronic hives. Hives do not spread from person to person (are not contagious). Hives can happen when your body reacts to something you are allergic to (allergen) or to something that irritates your skin. When you are exposed to something that triggers hives, your body releases a chemical called histamine. This causes redness, itching, and swelling. Hives can show up right after you are exposed to a trigger or hours later. What are the causes? Hives may be caused by: Food allergies. Insect bites or stings. Allergies to pollen or pets. Spending time in sunlight, heat, or cold (exposure). Exercise. Stress. You can also get hives from other conditions and treatments. These include: Viruses, such as the common cold. Bacterial infections, such as urinary tract infections and strep throat. Certain medicines. Contact with latex or chemicals. Allergy shots. Blood transfusions. In some cases, the cause of hives is not known (idiopathic hives). What increases the risk? You are more likely to get hives if: You are male. You have food allergies. Hives are more common if you are allergic to citrus fruits, milk, eggs, peanuts, tree nuts, or shellfish. You are allergic to: Medicines. Latex. Insects. Animals. Pollen. What are the signs or symptoms? Common symptoms of hives include raised, itchy, red or white bumps or patches on your skin. These areas may: Become large and swollen (welts). Quickly change shape and location. This may happen more than once. Be separate hives or connect over a large area of skin. Sting or  become painful. Turn white when pressed in the center (blanch). In severe cases, your hands, feet, and face may also become swollen. This may happen if hives form deeper in your skin. How is this diagnosed? Hives may be diagnosed based on your symptoms, medical history, and a physical exam. You may have skin, pee (urine), or blood tests done. These can help find out what is causing your hives and rule out other health issues. You may also have a biopsy done. This is when a small piece of skin is removed for testing. How is this treated? Treatment for hives depends on the cause and on how severe your symptoms are. You may be told to use cool, wet cloths (cool compresses) or to take cool showers to relieve itching. Treatment may also include: Medicines to help: Relieve itching (antihistamines). Reduce swelling (corticosteroids). Treat infection (antibiotics). An injectable medicine called omalizumab. You may need this if you have chronic idiopathic hives and still have symptoms even after you are treated with antihistamines. In severe cases, you may need to use a device filled with medicine that gives an emergency shot of epinephrine (auto-injector pen) to prevent a very bad allergic reaction (anaphylactic reaction). Follow these instructions at home: Medicines Take and apply over-the-counter and prescription medicines only as told by your health care provider. If you were prescribed antibiotics, take them as told by your provider. Do not stop using the antibiotic even if you start to feel better. Skin care Apply cool compresses to the affected areas. Do not scratch or rub your skin. General instructions Do not take hot  showers or baths. This can make itching worse. Do not wear tight-fitting clothing. Use sunscreen. Wear protective clothing when you are outside. Avoid anything that causes your hives. Keep a journal to help track what causes your hives. Write down: What medicines you  take. What you eat and drink. What products you use on your skin. Keep all follow-up visits. Your provider will track how well treatment is working. Contact a health care provider if: Your symptoms do not get better with medicine. Your joints are painful or swollen. You have a fever. You have pain in your abdomen. Get help right away if: Your tongue, lips, or eyelids swell. Your chest or throat feels tight. You have trouble breathing or swallowing. These symptoms may be an emergency. Use the auto-injector pen right away. Then call 911. Do not wait to see if the symptoms will go away. Do not drive yourself to the hospital. This information is not intended to replace advice given to you by your health care provider. Make sure you discuss any questions you have with your health care provider. Document Revised: 06/25/2022 Document Reviewed: 06/16/2022 Elsevier Patient Education  2024 [employer].

## 2023-07-20 NOTE — Progress Notes (Signed)
Acute Office Visit  Subjective:     Patient ID: Jack Grimes, male    DOB: May 28, 1956, 67 y.o.   MRN: [id number]  Chief Complaint  Patient presents with   Medical Management of Chronic Issues    HPI Patient is in today for history of hives and new angioedema. He brings in pictures. He has a long history of intermittent hives due to medications and foods but never lip swelling. This concerns him. First lip swelling was last Thursday. No problems breathing. Resolved with benadryl did not need prednisone. He is not on any anti-histamines. He feels like due to lipitor. He has been on it for 90 days and just got refilled. He stopped for 3 days. He took last night and had hives this morning and itching. No other medication changes.   Active Ambulatory Problems    Diagnosis Date Noted   NEOPLASM, MALIGNANT, PROSTATE 10/27/2007   ADJUSTMENT DISORDER WITH ANXIETY 01/21/2009   Subjective visual disturbance 09/16/2009   HYPERTENSION, BENIGN ESSENTIAL 06/09/2007   Gastroesophageal reflux disease 10/25/2007   ALLERGIC URTICARIA 01/21/2009   BACK PAIN, LUMBAR 10/25/2007   LYMPHOMA, HX OF 11/14/2007   GAD (generalized anxiety disorder) 10/24/2012   Acquired elongated uvula 01/23/2013   Erectile dysfunction 01/23/2013   BPH (benign prostatic hyperplasia) 01/23/2013   Right lateral epicondylitis 02/10/2013   Right knee pain 09/25/2013   Erectile dysfunction of non-organic origin 05/11/2014   Multiple joint pain 10/31/2014   Subconjunctival hemorrhage of right eye 04/25/2015   Carpal tunnel syndrome, left 04/25/2015   Left shoulder pain 01/01/2016   Strong pulse 08/14/2016   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/29/2017   Trouble in sleeping 12/09/2020   History of melanoma 01/20/2021   Multiple atypical skin moles 01/20/2021   Snoring 01/20/2021   Elevated bilirubin 01/20/2021   Sebaceous cyst 01/20/2021   Left inguinal hernia 08/04/2021   Left cervical radiculopathy 08/04/2021    Hyperplastic colonic polyp 08/04/2021   Hives 07/20/2023   Angio-edema 07/20/2023   Resolved Ambulatory Problems    Diagnosis Date Noted   No Resolved Ambulatory Problems   Past Medical History:  Diagnosis Date   Cancer (HCC)    GERD (gastroesophageal reflux disease)    Hypertension    Prostate cancer (HCC)     ROS  See HPI.     Objective:    BP 117/78   Pulse (!) 57   Ht 5\' 11"  (1.803 m)   Wt 172 lb (78 kg)   SpO2 99%   BMI 23.99 kg/m  BP Readings from Last 3 Encounters:  07/20/23 117/78  06/22/23 (!) 140/86  02/08/23 129/73   Wt Readings from Last 3 Encounters:  07/20/23 172 lb (78 kg)  06/22/23 170 lb (77.1 kg)  02/08/23 173 lb 1.9 oz (78.5 kg)      Physical Exam Constitutional:      Appearance: Normal appearance.  HENT:     Head: Normocephalic.  Cardiovascular:     Rate and Rhythm: Normal rate.  Pulmonary:     Effort: Pulmonary effort is normal.  Musculoskeletal:     Right lower leg: No edema.     Left lower leg: No edema.  Skin:    Findings: No rash.  Neurological:     Mental Status: He is alert.  Psychiatric:        Mood and Affect: Mood normal.           Assessment & Plan:  .Marland KitchenJohn was seen today for medical  management of chronic issues.  Diagnoses and all orders for this visit:  Hives -     EPINEPHrine 0.3 mg/0.3 mL IJ SOAJ injection; Inject 0.3 mg into the muscle once for 1 dose. -     Ambulatory referral to Allergy  Angioedema, initial encounter -     EPINEPHrine 0.3 mg/0.3 mL IJ SOAJ injection; Inject 0.3 mg into the muscle once for 1 dose. -     Ambulatory referral to Allergy   Unclear etiology of hives and angioedema Stop lipitor for now Epi pen given and discussed when to use it Keep benadryl and pepcid as needed  Keep diary of any hives and what you eat, apply to skin, or changes.  Referral to allergy  Return if symptoms worsen or fail to improve.  Tandy Gaw, PA-C

## 2023-07-21 ENCOUNTER — Telehealth: Payer: Self-pay | Admitting: Physician Assistant

## 2023-07-21 NOTE — Telephone Encounter (Signed)
Medications prednisone and pt's epipen were called in to wrong pharmacy. The correct pharmacy is       [id number] [place], [place] - [address] Phone: [phone]  Fax: [phone]

## 2023-07-22 MED ORDER — EPINEPHRINE 0.3 MG/0.3ML IJ SOAJ: 0.3000 mg | Freq: Once | INTRAMUSCULAR | 0 refills | Status: AC

## 2023-07-22 MED ORDER — PREDNISONE 20 MG PO TABS: 20.0000 mg | ORAL_TABLET | Freq: Every day | ORAL | 0 refills | Status: DC

## 2023-07-22 NOTE — Telephone Encounter (Signed)
Medications are not in the chart.

## 2023-07-22 NOTE — Telephone Encounter (Signed)
Left message advising patient of prescriptions.

## 2023-08-02 ENCOUNTER — Telehealth: Payer: Self-pay | Admitting: General Practice

## 2023-08-02 NOTE — Telephone Encounter (Signed)
Patient was scheduled for medicare wellness via telephone today at 2 pm. Called patient x 3 and left three voicemails. Unable to get in touch with patient. Appt cancelled to avoid no-show fee. PCP notified.

## 2023-10-21 DIAGNOSIS — N5201 Erectile dysfunction due to arterial insufficiency: Secondary | ICD-10-CM | POA: Diagnosis not present

## 2023-10-21 DIAGNOSIS — C61 Malignant neoplasm of prostate: Secondary | ICD-10-CM | POA: Diagnosis not present

## 2024-02-04 ENCOUNTER — Other Ambulatory Visit: Payer: Self-pay | Admitting: Physician Assistant

## 2024-02-04 DIAGNOSIS — Z87891 Personal history of nicotine dependence: Secondary | ICD-10-CM

## 2024-03-21 DIAGNOSIS — L564 Polymorphous light eruption: Secondary | ICD-10-CM | POA: Diagnosis not present

## 2024-03-21 DIAGNOSIS — D485 Neoplasm of uncertain behavior of skin: Secondary | ICD-10-CM | POA: Diagnosis not present

## 2024-03-21 DIAGNOSIS — Z85820 Personal history of malignant melanoma of skin: Secondary | ICD-10-CM | POA: Diagnosis not present

## 2024-03-21 DIAGNOSIS — Z129 Encounter for screening for malignant neoplasm, site unspecified: Secondary | ICD-10-CM | POA: Diagnosis not present

## 2024-03-21 DIAGNOSIS — Z85828 Personal history of other malignant neoplasm of skin: Secondary | ICD-10-CM | POA: Diagnosis not present

## 2024-03-24 ENCOUNTER — Other Ambulatory Visit: Payer: Self-pay | Admitting: Family Medicine

## 2024-03-24 DIAGNOSIS — D479 Neoplasm of uncertain behavior of lymphoid, hematopoietic and related tissue, unspecified: Secondary | ICD-10-CM | POA: Diagnosis not present

## 2024-04-25 ENCOUNTER — Ambulatory Visit

## 2024-04-26 ENCOUNTER — Encounter: Payer: Self-pay | Admitting: Physician Assistant

## 2024-04-26 ENCOUNTER — Ambulatory Visit (INDEPENDENT_AMBULATORY_CARE_PROVIDER_SITE_OTHER): Admitting: Physician Assistant

## 2024-04-26 ENCOUNTER — Other Ambulatory Visit: Payer: Self-pay

## 2024-04-26 VITALS — BP 122/82 | HR 59 | Ht 71.0 in | Wt 177.0 lb

## 2024-04-26 DIAGNOSIS — N3943 Post-void dribbling: Secondary | ICD-10-CM | POA: Diagnosis not present

## 2024-04-26 DIAGNOSIS — Z0000 Encounter for general adult medical examination without abnormal findings: Secondary | ICD-10-CM | POA: Diagnosis not present

## 2024-04-26 DIAGNOSIS — I10 Essential (primary) hypertension: Secondary | ICD-10-CM | POA: Diagnosis not present

## 2024-04-26 DIAGNOSIS — K219 Gastro-esophageal reflux disease without esophagitis: Secondary | ICD-10-CM | POA: Diagnosis not present

## 2024-04-26 DIAGNOSIS — R7309 Other abnormal glucose: Secondary | ICD-10-CM | POA: Diagnosis not present

## 2024-04-26 DIAGNOSIS — F411 Generalized anxiety disorder: Secondary | ICD-10-CM | POA: Diagnosis not present

## 2024-04-26 DIAGNOSIS — N401 Enlarged prostate with lower urinary tract symptoms: Secondary | ICD-10-CM | POA: Diagnosis not present

## 2024-04-26 DIAGNOSIS — Z87891 Personal history of nicotine dependence: Secondary | ICD-10-CM | POA: Diagnosis not present

## 2024-04-26 DIAGNOSIS — N5231 Erectile dysfunction following radical prostatectomy: Secondary | ICD-10-CM

## 2024-04-26 DIAGNOSIS — R17 Unspecified jaundice: Secondary | ICD-10-CM

## 2024-04-26 DIAGNOSIS — M19012 Primary osteoarthritis, left shoulder: Secondary | ICD-10-CM

## 2024-04-26 DIAGNOSIS — E7800 Pure hypercholesterolemia, unspecified: Secondary | ICD-10-CM

## 2024-04-26 MED ORDER — MELOXICAM 15 MG PO TABS: 15.0000 mg | ORAL_TABLET | Freq: Every day | ORAL | 3 refills | Status: DC

## 2024-04-26 MED ORDER — AMLODIPINE BESYLATE 10 MG PO TABS: ORAL_TABLET | ORAL | 3 refills | Status: DC

## 2024-04-26 MED ORDER — ESOMEPRAZOLE MAGNESIUM 40 MG PO CPDR: DELAYED_RELEASE_CAPSULE | ORAL | 3 refills | Status: DC

## 2024-04-26 MED ORDER — TERAZOSIN HCL 1 MG PO CAPS: 1.0000 mg | ORAL_CAPSULE | Freq: Every day | ORAL | 3 refills | Status: DC | PRN

## 2024-04-26 MED ORDER — DIAZEPAM 2 MG PO TABS: 2.0000 mg | ORAL_TABLET | Freq: Every evening | ORAL | 3 refills | Status: DC | PRN

## 2024-04-26 MED ORDER — SILDENAFIL CITRATE 50 MG PO TABS
50.0000 mg | ORAL_TABLET | Freq: Every day | ORAL | 5 refills | Status: AC | PRN
Start: 2024-04-26 — End: ?

## 2024-04-26 NOTE — Patient Instructions (Signed)
 Health Maintenance After Age 67 After age 4, you are at a higher risk for certain long-term diseases and infections as well as injuries from falls. Falls are a major cause of broken bones and head injuries in people who are older than age 47. Getting regular preventive care can help to keep you healthy and well. Preventive care includes getting regular testing and making lifestyle changes as recommended by your health care provider. Talk with your health care provider about: Which screenings and tests you should have. A screening is a test that checks for a disease when you have no symptoms. A diet and exercise plan that is right for you. What should I know about screenings and tests to prevent falls? Screening and testing are the best ways to find a health problem early. Early diagnosis and treatment give you the best chance of managing medical conditions that are common after age 37. Certain conditions and lifestyle choices may make you more likely to have a fall. Your health care provider may recommend: Regular vision checks. Poor vision and conditions such as cataracts can make you more likely to have a fall. If you wear glasses, make sure to get your prescription updated if your vision changes. Medicine review. Work with your health care provider to regularly review all of the medicines you are taking, including over-the-counter medicines. Ask your health care provider about any side effects that may make you more likely to have a fall. Tell your health care provider if any medicines that you take make you feel dizzy or sleepy. Strength and balance checks. Your health care provider may recommend certain tests to check your strength and balance while standing, walking, or changing positions. Foot health exam. Foot pain and numbness, as well as not wearing proper footwear, can make you more likely to have a fall. Screenings, including: Osteoporosis screening. Osteoporosis is a condition that causes  the bones to get weaker and break more easily. Blood pressure screening. Blood pressure changes and medicines to control blood pressure can make you feel dizzy. Depression screening. You may be more likely to have a fall if you have a fear of falling, feel depressed, or feel unable to do activities that you used to do. Alcohol use screening. Using too much alcohol can affect your balance and may make you more likely to have a fall. Follow these instructions at home: Lifestyle Do not drink alcohol if: Your health care provider tells you not to drink. If you drink alcohol: Limit how much you have to: 0-1 drink a day for women. 0-2 drinks a day for men. Know how much alcohol is in your drink. In the [place] one drink equals one 12 oz bottle of beer (355 mL), one 5 oz glass of wine (148 mL), or one 1 oz glass of hard liquor (44 mL). Do not use any products that contain nicotine or tobacco. These products include cigarettes, chewing tobacco, and vaping devices, such as e-cigarettes. If you need help quitting, ask your health care provider. Activity  Follow a regular exercise program to stay fit. This will help you maintain your balance. Ask your health care provider what types of exercise are appropriate for you. If you need a cane or walker, use it as recommended by your health care provider. Wear supportive shoes that have nonskid soles. Safety  Remove any tripping hazards, such as rugs, cords, and clutter. Install safety equipment such as grab bars in bathrooms and safety rails on stairs. Keep rooms and walkways  well-lit. General instructions Talk with your health care provider about your risks for falling. Tell your health care provider if: You fall. Be sure to tell your health care provider about all falls, even ones that seem minor. You feel dizzy, tiredness (fatigue), or off-balance. Take over-the-counter and prescription medicines only as told by your health care provider. These include  supplements. Eat a healthy diet and maintain a healthy weight. A healthy diet includes low-fat dairy products, low-fat (lean) meats, and fiber from whole grains, beans, and lots of fruits and vegetables. Stay current with your vaccines. Schedule regular health, dental, and eye exams. Summary Having a healthy lifestyle and getting preventive care can help to protect your health and wellness after age 11. Screening and testing are the best way to find a health problem early and help you avoid having a fall. Early diagnosis and treatment give you the best chance for managing medical conditions that are more common for people who are older than age 28. Falls are a major cause of broken bones and head injuries in people who are older than age 48. Take precautions to prevent a fall at home. Work with your health care provider to learn what changes you can make to improve your health and wellness and to prevent falls. This information is not intended to replace advice given to you by your health care provider. Make sure you discuss any questions you have with your health care provider. Document Revised: 02/17/2021 Document Reviewed: 02/17/2021 Elsevier Patient Education  2024 [employer].

## 2024-04-26 NOTE — Telephone Encounter (Unsigned)
 Copied from CRM [id number]. Topic: Clinical - Prescription Issue >> Apr 26, 2024 10:55 AM Jack Grimes wrote: Reason for CRM: Patient states he was just in the office today and he failed to tell the nurse that the sildenafil  (VIAGRA ) 50 MG tablet needed to be sent to [facility] in [place], not [place]. Please give the patient a call back to advise once this issue has been corrected. CB #: [id number].

## 2024-04-26 NOTE — Progress Notes (Unsigned)
 Complete physical exam  Patient: Jack Grimes   DOB: [birth year]   68 y.o. Male  MRN: [id number]  Subjective:    Chief Complaint  Patient presents with   Annual Exam    Jack Grimes is a 68 y.o. male who presents today for a complete physical exam. He reports consuming a {diet types:17450} diet. {types:19826} He generally feels {DESC; WELL/FAIRLY WELL/POORLY:18703}. He reports sleeping {DESC; WELL/FAIRLY WELL/POORLY:18703}. He {does/does not:200015} have additional problems to discuss today.    Most recent fall risk assessment:    02/08/2023    9:23 AM  Fall Risk   Falls in the past year? 0  Number falls in past yr: 0  Injury with Fall? 0  Risk for fall due to : No Fall Risks  Follow up Falls evaluation completed     Most recent depression screenings:    07/20/2023   11:12 AM 02/08/2023    9:23 AM  PHQ 2/9 Scores  PHQ - 2 Score 0 0    Vision:Within last year, Dental: No current dental problems and Receives regular dental care, and PSA: {CHL AMB PSA (Optional):27385}  {History (Optional):23778}  Patient Care Team: Demetrias Goodbar L, PA-C as PCP - General (Family Medicine) Grayce Buddle, RN Nurse Navigator as Registered Nurse (Medical Oncology)   Outpatient Medications Prior to Visit  Medication Sig   amLODipine  (NORVASC ) 10 MG tablet Take one tablet daily.   atorvastatin  (LIPITOR) 20 MG tablet TAKE 1 TABLET BY MOUTH EVERYDAY AT BEDTIME   clobetasol  cream (TEMOVATE ) 0.05 % Apply 1 Application topically 2 (two) times daily.   diazepam  (VALIUM ) 2 MG tablet Take 1 tablet (2 mg total) by mouth at bedtime as needed.   esomeprazole  (NEXIUM ) 40 MG capsule Take one capsule by mouth daily at 12 noon   fluticasone  (FLONASE ) 50 MCG/ACT nasal spray Place 1 spray into both nostrils daily.   meloxicam  (MOBIC ) 15 MG tablet Take 1 tablet (15 mg total) by mouth daily.   sildenafil  (VIAGRA ) 50 MG tablet Take 1 tablet (50 mg total) by mouth daily as needed for erectile dysfunction.   terazosin   (HYTRIN ) 1 MG capsule Take 1 capsule (1 mg total) by mouth daily as needed (urinary retention).   [DISCONTINUED] predniSONE  (DELTASONE ) 20 MG tablet Take 1 tablet (20 mg total) by mouth daily with breakfast.   No facility-administered medications prior to visit.    ROS        Objective:     BP 122/82   Pulse (!) 59   Ht 5' 11 (1.803 m)   Wt 177 lb (80.3 kg)   SpO2 99%   BMI 24.69 kg/m  {Vitals History (Optional):23777}  Physical Exam   No results found for any visits on 04/26/24. {Show previous labs (optional):23779}    Assessment & Plan:    Routine Health Maintenance and Physical Exam  Immunization History  Administered Date(s) Administered   PFIZER(Purple Top)SARS-COV-2 Vaccination 06/08/2020, 07/09/2020, 10/28/2020   Tdap 05/16/2012, 03/16/2023    Health Maintenance  Topic Date Due   Medicare Annual Wellness (AWV)  Never done   Zoster Vaccines- Shingrix (1 of 2) Never done   Pneumococcal Vaccine: 50+ Years (1 of 1 - PCV) Never done   Colonoscopy  08/03/2024   COVID-19 Vaccine (4 - 2024-25 season) 05/12/2024 (Originally 06/13/2023)   INFLUENZA VACCINE  08/14/2026 (Originally 05/12/2024)   DTaP/Tdap/Td (3 - Td or Tdap) 03/15/2033   Hepatitis C Screening  Completed   Hepatitis B Vaccines  Aged Out  HPV VACCINES  Aged Out   Meningococcal B Vaccine  Aged Out    Discussed health benefits of physical activity, and encouraged him to engage in regular exercise appropriate for his age and condition.  Problem List Items Addressed This Visit   None Visit Diagnoses       Routine physical examination    -  Primary      No follow-ups on file.     Migel Hannis, PA-C

## 2024-04-27 ENCOUNTER — Telehealth: Payer: Self-pay

## 2024-04-27 ENCOUNTER — Ambulatory Visit: Payer: Self-pay | Admitting: Physician Assistant

## 2024-04-27 LAB — CMP14+EGFR
ALT: 30 IU/L (ref 0–44)
AST: 17 IU/L (ref 0–40)
Albumin: 4.4 g/dL (ref 3.9–4.9)
Alkaline Phosphatase: 72 IU/L (ref 44–121)
BUN/Creatinine Ratio: 17 (ref 10–24)
BUN: 15 mg/dL (ref 8–27)
Bilirubin Total: 1 mg/dL (ref 0.0–1.2)
CO2: 20 mmol/L (ref 20–29)
Calcium: 9.2 mg/dL (ref 8.6–10.2)
Chloride: 101 mmol/L (ref 96–106)
Creatinine, Ser: 0.88 mg/dL (ref 0.76–1.27)
Globulin, Total: 2.3 g/dL (ref 1.5–4.5)
Glucose: 103 mg/dL — ABNORMAL HIGH (ref 70–99)
Potassium: 4.3 mmol/L (ref 3.5–5.2)
Sodium: 142 mmol/L (ref 134–144)
Total Protein: 6.7 g/dL (ref 6.0–8.5)
eGFR: 94 mL/min/1.73 (ref >59)

## 2024-04-27 LAB — CBC WITH DIFFERENTIAL/PLATELET
Basophils Absolute: 0.1 x10E3/uL (ref 0.0–0.2)
Basos: 1 %
EOS (ABSOLUTE): 0.3 x10E3/uL (ref 0.0–0.4)
Eos: 5 %
Hematocrit: 47.3 % (ref 37.5–51.0)
Hemoglobin: 16.1 g/dL (ref 13.0–17.7)
Immature Grans (Abs): 0 x10E3/uL (ref 0.0–0.1)
Immature Granulocytes: 0 %
Lymphocytes Absolute: 1.9 x10E3/uL (ref 0.7–3.1)
Lymphs: 27 %
MCH: 31.6 pg (ref 26.6–33.0)
MCHC: 34 g/dL (ref 31.5–35.7)
MCV: 93 fL (ref 79–97)
Monocytes Absolute: 0.6 x10E3/uL (ref 0.1–0.9)
Monocytes: 8 %
Neutrophils Absolute: 4.2 x10E3/uL (ref 1.4–7.0)
Neutrophils: 59 %
Platelets: 200 x10E3/uL (ref 150–450)
RBC: 5.1 x10E6/uL (ref 4.14–5.80)
RDW: 12.3 % (ref 11.6–15.4)
WBC: 7 x10E3/uL (ref 3.4–10.8)

## 2024-04-27 LAB — VITAMIN D 25 HYDROXY (VIT D DEFICIENCY, FRACTURES): Vit D, 25-Hydroxy: 42.5 ng/mL (ref 30.0–100.0)

## 2024-04-27 LAB — LIPID PANEL
Chol/HDL Ratio: 3.1 ratio (ref 0.0–5.0)
Cholesterol, Total: 119 mg/dL (ref 100–199)
HDL: 38 mg/dL — ABNORMAL LOW (ref >39)
LDL Chol Calc (NIH): 68 mg/dL (ref 0–99)
Triglycerides: 61 mg/dL (ref 0–149)
VLDL Cholesterol Cal: 13 mg/dL (ref 5–40)

## 2024-04-27 LAB — TSH: TSH: 1.18 u[IU]/mL (ref 0.450–4.500)

## 2024-04-27 LAB — PSA: Prostate Specific Ag, Serum: 0.1 ng/mL (ref 0.0–4.0)

## 2024-04-27 NOTE — Progress Notes (Signed)
 Reyansh,   Kidney and liver look good.  LDL, bad cholesterol, looks GREAT.  Prostate enzymes look nice and low.  Vitamin D  normal.  Thyroid looks good.  Fasting glucose just a little elevated. I would like a nurse to add on A1C to better evaluate for diabetes.

## 2024-04-27 NOTE — Telephone Encounter (Signed)
 Copied from CRM [id number]. Topic: Clinical - Lab/Test Results >> Apr 27, 2024  3:07 PM Farrel B wrote: Reason for CRM: patient called from [phone], stated that he had spoken to the nurse earlier about his labs and didn't understand the reasoning behind the need for testing his A1C levels. I informed him I could only read off the notes that were plates in the chart, but clinically I couldn't advise him except that the notes did ready he had an elevated glucose. I advised the patient I would have someone from the clinical team to reach out to him.

## 2024-04-28 ENCOUNTER — Encounter: Payer: Self-pay | Admitting: Physician Assistant

## 2024-04-29 LAB — SPECIMEN STATUS REPORT

## 2024-04-29 LAB — HGB A1C W/O EAG: Hgb A1c MFr Bld: 5.2 (ref 4.8–5.6)

## 2024-05-01 ENCOUNTER — Other Ambulatory Visit: Payer: Self-pay | Admitting: Physician Assistant

## 2024-05-01 DIAGNOSIS — F411 Generalized anxiety disorder: Secondary | ICD-10-CM

## 2024-05-01 DIAGNOSIS — N3943 Post-void dribbling: Secondary | ICD-10-CM

## 2024-05-01 DIAGNOSIS — N5231 Erectile dysfunction following radical prostatectomy: Secondary | ICD-10-CM

## 2024-05-01 DIAGNOSIS — I10 Essential (primary) hypertension: Secondary | ICD-10-CM

## 2024-05-01 DIAGNOSIS — K219 Gastro-esophageal reflux disease without esophagitis: Secondary | ICD-10-CM

## 2024-05-01 DIAGNOSIS — M19012 Primary osteoarthritis, left shoulder: Secondary | ICD-10-CM

## 2024-05-01 NOTE — Telephone Encounter (Signed)
 Copied from CRM [id number]. Topic: Clinical - Medication Refill >> May 01, 2024 10:52 AM Shelda HERO wrote: Patient medications were sent over to the incorrect Pharmacy last week [facility] ([facility] Patient called [facility] already to cancel that order but needs these now sent [facility] the [facility]   Medication:  amLODipine  (NORVASC ) 10 MG tablet diazepam  (VALIUM ) 2 MG tablet [[id number]] esomeprazole  (NEXIUM ) 40 MG capsule [[id number]] meloxicam  (MOBIC ) 15 MG tablet [[id number]] sildenafil  (VIAGRA ) 50 MG tablet [[id number]] erazosin (HYTRIN ) 1 MG capsule [[id number]]  Has the patient contacted their pharmacy? Yes  This is the patient's preferred pharmacy:   [id number] [facility] [place] - [address] [facility] [address] [place] [address] Phone: [phone] Fax: [phone]   Is this the correct pharmacy for this prescription? Yes If no, delete pharmacy and type the correct one.   Has the prescription been filled recently? Yes  Is the patient out of the medication? No  Has the patient been seen for an appointment in the last year OR does the patient have an upcoming appointment? Yes  Can we respond through MyChart? No (Call please)  Agent: Please be advised that Rx refills may take up to 3 business days. We ask that you follow-up with your pharmacy.

## 2024-05-01 NOTE — Progress Notes (Signed)
 A1C still normal range. No diabetes.

## 2024-05-02 ENCOUNTER — Other Ambulatory Visit: Payer: Self-pay | Admitting: Physician Assistant

## 2024-05-02 DIAGNOSIS — F411 Generalized anxiety disorder: Secondary | ICD-10-CM

## 2024-05-02 NOTE — Telephone Encounter (Signed)
 Medications already sent to pharmacy

## 2024-05-03 ENCOUNTER — Other Ambulatory Visit: Payer: Self-pay

## 2024-05-03 ENCOUNTER — Telehealth: Payer: Self-pay

## 2024-05-03 DIAGNOSIS — F411 Generalized anxiety disorder: Secondary | ICD-10-CM

## 2024-05-03 DIAGNOSIS — K219 Gastro-esophageal reflux disease without esophagitis: Secondary | ICD-10-CM

## 2024-05-03 DIAGNOSIS — M19012 Primary osteoarthritis, left shoulder: Secondary | ICD-10-CM

## 2024-05-03 DIAGNOSIS — I10 Essential (primary) hypertension: Secondary | ICD-10-CM

## 2024-05-03 DIAGNOSIS — N3943 Post-void dribbling: Secondary | ICD-10-CM

## 2024-05-03 MED ORDER — ESOMEPRAZOLE MAGNESIUM 40 MG PO CPDR: DELAYED_RELEASE_CAPSULE | ORAL | 3 refills | Status: AC

## 2024-05-03 MED ORDER — DIAZEPAM 2 MG PO TABS: 2.0000 mg | ORAL_TABLET | Freq: Every evening | ORAL | 3 refills | Status: AC | PRN

## 2024-05-03 MED ORDER — TERAZOSIN HCL 1 MG PO CAPS: 1.0000 mg | ORAL_CAPSULE | Freq: Every day | ORAL | 3 refills | Status: AC | PRN

## 2024-05-03 MED ORDER — AMLODIPINE BESYLATE 10 MG PO TABS: ORAL_TABLET | ORAL | 3 refills | Status: AC

## 2024-05-03 MED ORDER — MELOXICAM 15 MG PO TABS: 15.0000 mg | ORAL_TABLET | Freq: Every day | ORAL | 3 refills | Status: AC

## 2024-05-03 NOTE — Telephone Encounter (Signed)
 Copied from CRM [id number]. Topic: Clinical - Medication Refill >> May 01, 2024 10:52 AM Shelda HERO wrote: Patient medications were sent over to the incorrect Pharmacy last week [facility] ([facility] Patient called [facility] already to cancel that order but needs these now sent [facility] the [facility]   Medication:  amLODipine  (NORVASC ) 10 MG tablet diazepam  (VALIUM ) 2 MG tablet [[id number]] esomeprazole  (NEXIUM ) 40 MG capsule [[id number]] meloxicam  (MOBIC ) 15 MG tablet [[id number]] sildenafil  (VIAGRA ) 50 MG tablet [[id number]] erazosin (HYTRIN ) 1 MG capsule [[id number]]  Has the patient contacted their pharmacy? Yes  This is the patient's preferred pharmacy:   [id number] [place], [place] - [address] [facility] [address] [place] [address] Phone: [phone] Fax: [phone]   Is this the correct pharmacy for this prescription? Yes If no, delete pharmacy and type the correct one.   Has the prescription been filled recently? Yes  Is the patient out of the medication? No  Has the patient been seen for an appointment in the last year OR does the patient have an upcoming appointment? Yes  Can we respond through MyChart? No (Call please)  Agent: Please be advised that Rx refills may take up to 3 business days. We ask that you follow-up with your pharmacy. >> May 01, 2024  3:39 PM Adrianna P wrote: Patient went to pharmacy for medicine and they still don't have it >> May 01, 2024 11:04 AM Bridgette M wrote: **adding from patient request: sildenafil  (VIAGRA ) 50 MG tablet needs to be sent to [facility] in [place]. [facility].

## 2024-05-03 NOTE — Telephone Encounter (Signed)
 Was able to resent all prescription to requested pharmacy except the diazepam  as it is controlled will have to moved to the requested pharmacy by Vermell Bologna, [place] Pended for provider

## 2024-05-03 NOTE — Telephone Encounter (Signed)
 Sent message to provider in separate message.

## 2024-06-13 ENCOUNTER — Encounter: Payer: Self-pay | Admitting: Sports Medicine

## 2024-09-21 ENCOUNTER — Other Ambulatory Visit: Payer: Self-pay | Admitting: Physician Assistant

## 2024-10-27 ENCOUNTER — Ambulatory Visit: Admitting: Physician Assistant

## 2024-11-07 ENCOUNTER — Ambulatory Visit: Admitting: Physician Assistant

## 2024-11-14 ENCOUNTER — Ambulatory Visit: Admitting: Physician Assistant

## 2024-11-21 ENCOUNTER — Ambulatory Visit: Admitting: Physician Assistant
